# Patient Record
Sex: Female | Born: 1965 | Race: White | Hispanic: No | Marital: Single | Smoking: Former smoker
Health system: Southern US, Community
[De-identification: ages and names within clinical notes are randomized; demographics above are authoritative.]

## PROBLEM LIST (undated history)

## (undated) DIAGNOSIS — N870 Mild cervical dysplasia: Secondary | ICD-10-CM

## (undated) DIAGNOSIS — F419 Anxiety disorder, unspecified: Secondary | ICD-10-CM

## (undated) DIAGNOSIS — R011 Cardiac murmur, unspecified: Secondary | ICD-10-CM

## (undated) DIAGNOSIS — T7840XA Allergy, unspecified, initial encounter: Secondary | ICD-10-CM

## (undated) DIAGNOSIS — F32A Depression, unspecified: Secondary | ICD-10-CM

## (undated) DIAGNOSIS — F329 Major depressive disorder, single episode, unspecified: Secondary | ICD-10-CM

## (undated) DIAGNOSIS — D649 Anemia, unspecified: Secondary | ICD-10-CM

## (undated) HISTORY — PX: WISDOM TOOTH EXTRACTION: SHX21

## (undated) HISTORY — PX: OTHER SURGICAL HISTORY: SHX169

## (undated) HISTORY — DX: Depression, unspecified: F32.A

## (undated) HISTORY — DX: Anemia, unspecified: D64.9

## (undated) HISTORY — DX: Allergy, unspecified, initial encounter: T78.40XA

## (undated) HISTORY — DX: Anxiety disorder, unspecified: F41.9

## (undated) HISTORY — DX: Cardiac murmur, unspecified: R01.1

## (undated) HISTORY — PX: COLPOSCOPY: SHX161

## (undated) HISTORY — DX: Mild cervical dysplasia: N87.0

## (undated) HISTORY — DX: Major depressive disorder, single episode, unspecified: F32.9

---

## 1998-01-02 ENCOUNTER — Other Ambulatory Visit: Admission: RE | Admit: 1998-01-02 | Discharge: 1998-01-02 | Payer: Self-pay | Admitting: Obstetrics and Gynecology

## 2001-08-24 ENCOUNTER — Other Ambulatory Visit: Admission: RE | Admit: 2001-08-24 | Discharge: 2001-08-24 | Payer: Self-pay | Admitting: Gynecology

## 2002-01-24 HISTORY — PX: CERVICAL BIOPSY  W/ LOOP ELECTRODE EXCISION: SUR135

## 2002-08-04 ENCOUNTER — Other Ambulatory Visit: Admission: RE | Admit: 2002-08-04 | Discharge: 2002-08-04 | Payer: Self-pay | Admitting: Gynecology

## 2003-05-04 ENCOUNTER — Other Ambulatory Visit: Admission: RE | Admit: 2003-05-04 | Discharge: 2003-05-04 | Payer: Self-pay | Admitting: Gynecology

## 2003-07-30 ENCOUNTER — Emergency Department (HOSPITAL_COMMUNITY): Admission: EM | Admit: 2003-07-30 | Discharge: 2003-07-30 | Payer: Self-pay | Admitting: Emergency Medicine

## 2003-08-22 ENCOUNTER — Encounter: Admission: RE | Admit: 2003-08-22 | Discharge: 2003-08-22 | Payer: Self-pay | Admitting: Gynecology

## 2004-05-07 ENCOUNTER — Other Ambulatory Visit: Admission: RE | Admit: 2004-05-07 | Discharge: 2004-05-07 | Payer: Self-pay | Admitting: Gynecology

## 2005-03-19 ENCOUNTER — Encounter: Admission: RE | Admit: 2005-03-19 | Discharge: 2005-03-19 | Payer: Self-pay | Admitting: Gynecology

## 2005-05-27 ENCOUNTER — Other Ambulatory Visit: Admission: RE | Admit: 2005-05-27 | Discharge: 2005-05-27 | Payer: Self-pay | Admitting: Gynecology

## 2005-09-17 ENCOUNTER — Encounter: Admission: RE | Admit: 2005-09-17 | Discharge: 2005-09-17 | Payer: Self-pay | Admitting: Gynecology

## 2007-05-25 ENCOUNTER — Other Ambulatory Visit: Admission: RE | Admit: 2007-05-25 | Discharge: 2007-05-25 | Payer: Self-pay | Admitting: Gynecology

## 2007-09-14 ENCOUNTER — Encounter: Admission: RE | Admit: 2007-09-14 | Discharge: 2007-09-14 | Payer: Self-pay | Admitting: Gynecology

## 2008-09-26 ENCOUNTER — Encounter: Admission: RE | Admit: 2008-09-26 | Discharge: 2008-09-26 | Payer: Self-pay | Admitting: Gynecology

## 2009-09-27 ENCOUNTER — Encounter: Admission: RE | Admit: 2009-09-27 | Discharge: 2009-09-27 | Payer: Self-pay | Admitting: Gynecology

## 2010-07-03 ENCOUNTER — Other Ambulatory Visit (HOSPITAL_COMMUNITY)
Admission: RE | Admit: 2010-07-03 | Discharge: 2010-07-03 | Disposition: A | Discharge status: Home / Self Care | Payer: BC Managed Care – PPO | Source: Ambulatory Visit | Attending: Gynecology | Admitting: Gynecology

## 2010-07-03 ENCOUNTER — Other Ambulatory Visit: Payer: Self-pay | Admitting: Gynecology

## 2010-07-03 ENCOUNTER — Ambulatory Visit (INDEPENDENT_AMBULATORY_CARE_PROVIDER_SITE_OTHER): Payer: BC Managed Care – PPO | Admitting: Gynecology

## 2010-07-03 DIAGNOSIS — Z01419 Encounter for gynecological examination (general) (routine) without abnormal findings: Secondary | ICD-10-CM

## 2010-07-03 DIAGNOSIS — R635 Abnormal weight gain: Secondary | ICD-10-CM

## 2010-07-03 DIAGNOSIS — Z833 Family history of diabetes mellitus: Secondary | ICD-10-CM

## 2010-07-03 DIAGNOSIS — Z124 Encounter for screening for malignant neoplasm of cervix: Secondary | ICD-10-CM | POA: Insufficient documentation

## 2010-07-03 DIAGNOSIS — Z13220 Encounter for screening for lipoid disorders: Secondary | ICD-10-CM

## 2010-07-12 NOTE — Consult Note (Signed)
Ana Griffin NO.:  [id number]   MEDICAL RECORD NO.:  [id number]                   PATIENT TYPE:  EMS   LOCATION:  ED                                   FACILITY:  [facility]   PHYSICIAN:  Dionne Ano. Everlene Other, M.D.         DATE OF BIRTH:  1966/02/04   DATE OF CONSULTATION:  07/30/2003  DATE OF DISCHARGE:                                   CONSULTATION   I had the pleasure to see [name] today in the [facility] due to injury she sustained to her left hand.  The patient  was noted to have sustained a dog bite from her border collie who has his  shots.  Subsequent to this, the patient developed significant swelling and  ascending type lymphangitis and infection.  The bite in question is over the  PIP area.  She has had a CBC drawn at an urgent care which was minimally  high.  She has had no x-rays.  She complains of pain in the left hand in  general.  She denies fever, chills, nausea, vomiting.  The patient and I  have discussed this at length and her findings.  She denies history of prior  dog bites or injury to the hand.   The patient notes no other unusual exposures.   PAST MEDICAL HISTORY:  She has negative past medical history.   PAST SURGICAL HISTORY:  None.   MEDICATIONS:  Wellbutrin and Allegra.   ALLERGIES:  No known drug allergies.   SOCIAL HISTORY:  She quit cigarette smoking 46 days ago.  She works for  [employer].  She denies IVDA.   PHYSICAL EXAMINATION:  GENERAL APPEARANCE:  The patient is alert and  oriented, in no acute distress.  VITAL SIGNS:  Stable.  She is afebrile.  HEENT:  Within normal limits.  CHEST:  Clear.  CARDIOVASCULAR:  Regular rate.  ABDOMEN:  Nontender and nondistended.  EXTREMITIES:  She has no signs of swelling, edema or neurovascular  compromise in the lower extremities.  The left upper extremity has a  laceration of the PIP joint.  There is mild swelling here.   This is  transverse in nature.  The PIP joint is not particularly tender with passive  range of motion and there is no instability.  She does have redness, slight  warmth and ascending swelling in the hand over the MCP region, however,  there is no frank joint tenderness with MCP gentle range of motion  passively.  There are no signs of dystrophic reaction or vascular  compromise.   I have reviewed x-rays today.  Three views of the hand which showed no  fracture, dislocation, or space occupying lesion.  There is no foreign body  or evidence of dog tooth, etc.   IMPRESSION:  Dog bite, left hand about the PIP region of the index finger  with ascending lymphangitis (early tenosynovitis).  PLAN:  I have discussed with the patient our findings and discussed with her  risks and benefits of surgical intervention.  She was then taken to the  operating suite and underwent block anesthesia performed by myself with  lidocaine without epinephrine.  Following this, she was prepped and draped  in the usual sterile fashion with Betadine scrub and paint.  Once this was  done, I then performed an I&D of skin and subcutaneous tissue followed by  irrigation and I&D of the flexor tendon sheath apparatus.  She did not have  any frank disruption of the tendon at the central slip insertion of the PIP  joint nor was there deep encroachment into the PIP region.  I did identify  and irrigate copiously the extensor tendon sheath.  This did track  proximally.  Following this, I then wicked the wound.  She was then placed  on antibiotics in the form of Unasyn to be followed by Augmentin.  She was  placed on pain medicine in the form of Vicodin.  She will be monitored  extremely closely due to the ascending lymphangitis, etc.  I have discussed  with her do's and don'ts etc., and will plan to reassess her extremity  tomorrow.  She understands the do's and don'ts, risks and benefits of  treatment, etc., and all  questions have been encouraged and answered.                                               Dionne Ano. Everlene Other, M.D.    Nash Mantis  D:  07/30/2003  T:  07/31/2003  Job:  [id number]

## 2010-08-27 ENCOUNTER — Other Ambulatory Visit: Payer: Self-pay | Admitting: Gynecology

## 2010-08-27 DIAGNOSIS — Z1231 Encounter for screening mammogram for malignant neoplasm of breast: Secondary | ICD-10-CM

## 2010-10-08 ENCOUNTER — Ambulatory Visit
Admission: RE | Admit: 2010-10-08 | Discharge: 2010-10-08 | Disposition: A | Discharge status: Home / Self Care | Payer: BC Managed Care – PPO | Source: Ambulatory Visit | Attending: Gynecology | Admitting: Gynecology

## 2010-10-08 DIAGNOSIS — Z1231 Encounter for screening mammogram for malignant neoplasm of breast: Secondary | ICD-10-CM

## 2011-09-23 ENCOUNTER — Other Ambulatory Visit: Payer: Self-pay | Admitting: Gynecology

## 2011-09-23 DIAGNOSIS — Z1231 Encounter for screening mammogram for malignant neoplasm of breast: Secondary | ICD-10-CM

## 2011-10-01 ENCOUNTER — Encounter: Payer: BC Managed Care – PPO | Admitting: Gynecology

## 2011-10-16 ENCOUNTER — Ambulatory Visit
Admission: RE | Admit: 2011-10-16 | Discharge: 2011-10-16 | Disposition: A | Discharge status: Home / Self Care | Payer: BC Managed Care – PPO | Source: Ambulatory Visit | Attending: Gynecology | Admitting: Gynecology

## 2011-10-16 DIAGNOSIS — Z1231 Encounter for screening mammogram for malignant neoplasm of breast: Secondary | ICD-10-CM

## 2011-10-21 ENCOUNTER — Encounter: Payer: Self-pay | Admitting: Gynecology

## 2011-10-21 ENCOUNTER — Other Ambulatory Visit (HOSPITAL_COMMUNITY)
Admission: RE | Admit: 2011-10-21 | Discharge: 2011-10-21 | Disposition: A | Discharge status: Home / Self Care | Payer: BC Managed Care – PPO | Source: Ambulatory Visit | Attending: Gynecology | Admitting: Gynecology

## 2011-10-21 ENCOUNTER — Ambulatory Visit (INDEPENDENT_AMBULATORY_CARE_PROVIDER_SITE_OTHER): Payer: BC Managed Care – PPO | Admitting: Gynecology

## 2011-10-21 VITALS — BP 108/68 | Ht 63.0 in | Wt 152.0 lb

## 2011-10-21 DIAGNOSIS — F329 Major depressive disorder, single episode, unspecified: Secondary | ICD-10-CM

## 2011-10-21 DIAGNOSIS — N870 Mild cervical dysplasia: Secondary | ICD-10-CM | POA: Insufficient documentation

## 2011-10-21 DIAGNOSIS — R635 Abnormal weight gain: Secondary | ICD-10-CM | POA: Insufficient documentation

## 2011-10-21 DIAGNOSIS — Z01419 Encounter for gynecological examination (general) (routine) without abnormal findings: Secondary | ICD-10-CM

## 2011-10-21 DIAGNOSIS — Z23 Encounter for immunization: Secondary | ICD-10-CM

## 2011-10-21 DIAGNOSIS — N946 Dysmenorrhea, unspecified: Secondary | ICD-10-CM

## 2011-10-21 DIAGNOSIS — Z1151 Encounter for screening for human papillomavirus (HPV): Secondary | ICD-10-CM | POA: Insufficient documentation

## 2011-10-21 HISTORY — DX: Abnormal weight gain: R63.5

## 2011-10-21 MED ORDER — BUPROPION HCL ER (SR) 150 MG PO TB12: 150.0000 mg | ORAL_TABLET | Freq: Two times a day (BID) | ORAL | Status: DC

## 2011-10-21 MED ORDER — IBUPROFEN 800 MG PO TABS: 800.0000 mg | ORAL_TABLET | Freq: Three times a day (TID) | ORAL | Status: AC | PRN

## 2011-10-21 NOTE — Patient Instructions (Addendum)
Diphtheria, Tetanus, and Pertussis (DTaP) Vaccine What You Need to Know WHY GET VACCINATED? Diphtheria, tetanus, and pertussis are serious diseases caused by bacteria. Diphtheria and pertussis are spread from person to person. Tetanus enters the body through cuts or wounds. Diphtheria causes a thick covering in the back of the throat.  It can lead to breathing problems, paralysis, heart failure, and even death.  Tetanus (Lockjaw) causes painful tightening of the muscles, usually all over the body.  It can lead to "locking" of the jaw so the victim cannot open his or her mouth or swallow. Tetanus leads to death in about 2 out of 10 cases.  Pertussis (Whooping Cough) causes coughing spells so bad that it is hard for infants to eat, drink, or breathe. These spells can last for weeks.  It can lead to pneumonia, seizures (jerking and staring spells), brain damage, and death.  Diphtheria, tetanus, and pertussis vaccine (DTaP) can help prevent these diseases. Most children who are vaccinated with DTaP will be protected throughout childhood. Many more children would get these diseases if we stopped vaccinating. DTaP is a safer version of an older vaccine called DTP. DTP is no longer used in the [place]. WHO SHOULD GET DTAP VACCINE AND WHEN? Children should get 5 doses of DTaP vaccine, 1 dose at each of the following ages:  2 months.   4 months.   6 months.   15 to 18 months.   4 to 6 years.  DTaP may be given at the same time as other vaccines. SOME CHILDREN SHOULD NOT GET DTAP VACCINE OR SHOULD WAIT  Children with minor illnesses, such as a cold, may be vaccinated. But children who are moderately or severely ill should usually wait until they recover before getting DTaP vaccine.   Any child who had a life-threatening allergic reaction after a dose of DTaP should not get another dose.   Any child who suffered a brain or nervous system disease within 7 days after a dose of DTaP should  not get another dose.   Talk with your caregiver if your child:   Had a seizure or collapsed after a dose of DTaP.   Cried non-stop for 3 hours or more after a dose of DTaP.   Had a fever over 105 F (40.6 C) after a dose of DTaP.   Ask your caregiver for more information. Some of these children should not get another dose of pertussis vaccine, but may get a vaccine without pertussis, called DT.  OLDER CHILDREN AND ADULTS  DTaP is not licensed for adolescents, adults, or children 1 years of age and older.   But older people still need protection. A vaccine called Tdap is similar to DTaP. A single dose of Tdap is recommended for people 11 through 44 years of age. Another vaccine, called Td, protects against tetanus and diphtheria, but not pertussis. It is recommended every 10 years.  WHAT ARE THE RISKS FROM DTAP VACCINE?  Getting diphtheria, tetanus, or pertussis disease is much riskier than getting DTaP vaccine.   However, a vaccine, like any medicine, is capable of causing serious problems, such as severe allergic reactions. The risk of DTaP vaccine causing serious harm, or death, is extremely small.  Mild Problems (Common)  Fever (up to about 1 child in 4).   Redness or swelling where the shot was given (up to about 1 child in 4).   Soreness or tenderness where the shot was given (up to about 1 child in 4).  These problems occur more often after the 4th and 5th doses of the DTaP series than after earlier doses. Sometimes the 4th or 5th dose of DTaP vaccine is followed by swelling of the entire arm or leg in which the shot was given, lasting 1 to 7 days (up to about 1 child in 30). Other mild problems include:  Fussiness (up to about 1 child in 3).   Tiredness or poor appetite (up to about 1 child in 10).   Vomiting (up to about 1 child in 50).  These problems generally occur 1 to 3 days after the shot. Moderate Problems (Uncommon)  Seizure (jerking or staring) (about 1 child  out of 14,000).   Non-stop crying, for 3 hours or more (up to about 1 child out of 1,000).   High fever, over 105 F (40.6 C) (about 1 child out of 16,000).  Severe Problems (Very Rare)  Serious allergic reaction (less than 1 out of a million doses).   Several other severe problems have been reported after DTaP vaccine. These include:   Long-term seizures, coma, or lowered consciousness.   Permanent brain damage.  These are so rare it is hard to tell if they are caused by the vaccine. Controlling fever is especially important for children who have had seizures, for any reason. It is also important if another family member has had seizures. You can reduce fever and pain by giving your child an aspirin-free pain reliever when the shot is given, and for the next 24 hours, following the package instructions. WHAT IF THERE IS A MODERATE OR SEVERE REACTION? What should I look for? Any unusual conditions, such as a serious allergic reaction, high fever, or unusual behavior. Serious allergic reactions are extremely rare with any vaccine. If one were to occur, it would most likely be within a few minutes to a few hours after the shot. Signs can include difficulty breathing, hoarseness or wheezing, hives, paleness, weakness, a fast heartbeat, or dizziness. If a high fever or seizure were to occur, it would usually be within a week after the shot. What should I do?  Call your caregiver or get the person to a caregiver right away.   Tell the caregiver what happened, the date and time it happened, and when the vaccination was given.   Ask the caregiver, nurse, or health department to file a Vaccine Adverse Event Reporting System (VAERS) form. Or, you can file this report through the VAERS website at www.vaers.LAgents.no or by calling 1-3300768461.  VAERS does not provide medical advice. THE NATIONAL VACCINE INJURY COMPENSATION PROGRAM  In the rare event that you or your child has a serious reaction  to a vaccine, a federal program has been created to help you pay for the care of those who have been harmed.   For details about the National Vaccine Injury Compensation Program, call 705-646-9892 or visit the program's website at SpiritualWord.at  HOW CAN I LEARN MORE?  Ask your caregiver. They can give you the vaccine package insert or suggest other sources of information.   Call your local or state health department's immunization program.   Contact the Centers for Disease Control and Prevention (CDC):   Call 6286038875 (1-800-CDC-INFO).   Visit the [employer] at PicCapture.uy  CDC Diphtheria, Tetanus, and Pertussis (DTaP) Vaccine VIS (07/10/05) Document Released: 12/08/2005 Document Revised: 01/30/2011 Document Reviewed: 12/08/2005 [facility] Patient Information 2012 [place], [place].  Health Maintenance, Females A healthy lifestyle and preventative care can promote health and  wellness.  Maintain regular health, dental, and eye exams.   Eat a healthy diet. Foods like vegetables, fruits, whole grains, low-fat dairy products, and lean protein foods contain the nutrients you need without too many calories. Decrease your intake of foods high in solid fats, added sugars, and salt. Get information about a proper diet from your caregiver, if necessary.   Regular physical exercise is one of the most important things you can do for your health. Most adults should get at least 150 minutes of moderate-intensity exercise (any activity that increases your heart rate and causes you to sweat) each week. In addition, most adults need muscle-strengthening exercises on 2 or more days a week.    Maintain a healthy weight. The body mass index (BMI) is a screening tool to identify possible weight problems. It provides an estimate of body fat based on height and weight. Your caregiver can help determine your BMI, and can help you achieve or maintain a  healthy weight. For adults 20 years and older:   A BMI below 18.5 is considered underweight.   A BMI of 18.5 to 24.9 is normal.   A BMI of 25 to 29.9 is considered overweight.   A BMI of 30 and above is considered obese.   Maintain normal blood lipids and cholesterol by exercising and minimizing your intake of saturated fat. Eat a balanced diet with plenty of fruits and vegetables. Blood tests for lipids and cholesterol should begin at age 69 and be repeated every 5 years. If your lipid or cholesterol levels are high, you are over 50, or you are a high risk for heart disease, you may need your cholesterol levels checked more frequently.Ongoing high lipid and cholesterol levels should be treated with medicines if diet and exercise are not effective.   If you smoke, find out from your caregiver how to quit. If you do not use tobacco, do not start.   If you are pregnant, do not drink alcohol. If you are breastfeeding, be very cautious about drinking alcohol. If you are not pregnant and choose to drink alcohol, do not exceed 1 drink per day. One drink is considered to be 12 ounces (355 mL) of beer, 5 ounces (148 mL) of wine, or 1.5 ounces (44 mL) of liquor.   Avoid use of street drugs. Do not share needles with anyone. Ask for help if you need support or instructions about stopping the use of drugs.   High blood pressure causes heart disease and increases the risk of stroke. Blood pressure should be checked at least every 1 to 2 years. Ongoing high blood pressure should be treated with medicines, if weight loss and exercise are not effective.   If you are 57 to 44 years old, ask your caregiver if you should take aspirin to prevent strokes.   Diabetes screening involves taking a blood sample to check your fasting blood sugar level. This should be done once every 3 years, after age 11, if you are within normal weight and without risk factors for diabetes. Testing should be considered at a younger  age or be carried out more frequently if you are overweight and have at least 1 risk factor for diabetes.   Breast cancer screening is essential preventative care for women. You should practice "breast self-awareness." This means understanding the normal appearance and feel of your breasts and may include breast self-examination. Any changes detected, no matter how small, should be reported to a caregiver. Women in their 43s and  30s should have a clinical breast exam (CBE) by a caregiver as part of a regular health exam every 1 to 3 years. After age 61, women should have a CBE every year. Starting at age 60, women should consider having a mammogram (breast X-ray) every year. Women who have a family history of breast cancer should talk to their caregiver about genetic screening. Women at a high risk of breast cancer should talk to their caregiver about having an MRI and a mammogram every year.   The Pap test is a screening test for cervical cancer. Women should have a Pap test starting at age 32. Between ages 45 and 40, Pap tests should be repeated every 2 years. Beginning at age 46, you should have a Pap test every 3 years as long as the past 3 Pap tests have been normal. If you had a hysterectomy for a problem that was not cancer or a condition that could lead to cancer, then you no longer need Pap tests. If you are between ages 60 and 63, and you have had normal Pap tests going back 10 years, you no longer need Pap tests. If you have had past treatment for cervical cancer or a condition that could lead to cancer, you need Pap tests and screening for cancer for at least 20 years after your treatment. If Pap tests have been discontinued, risk factors (such as a new sexual partner) need to be reassessed to determine if screening should be resumed. Some women have medical problems that increase the chance of getting cervical cancer. In these cases, your caregiver may recommend more frequent screening and Pap  tests.   The human papillomavirus (HPV) test is an additional test that may be used for cervical cancer screening. The HPV test looks for the virus that can cause the cell changes on the cervix. The cells collected during the Pap test can be tested for HPV. The HPV test could be used to screen women aged 1 years and older, and should be used in women of any age who have unclear Pap test results. After the age of 51, women should have HPV testing at the same frequency as a Pap test.   Colorectal cancer can be detected and often prevented. Most routine colorectal cancer screening begins at the age of 37 and continues through age 34. However, your caregiver may recommend screening at an earlier age if you have risk factors for colon cancer. On a yearly basis, your caregiver may provide home test kits to check for hidden blood in the stool. Use of a small camera at the end of a tube, to directly examine the colon (sigmoidoscopy or colonoscopy), can detect the earliest forms of colorectal cancer. Talk to your caregiver about this at age 36, when routine screening begins. Direct examination of the colon should be repeated every 5 to 10 years through age 36, unless early forms of pre-cancerous polyps or small growths are found.   Hepatitis C blood testing is recommended for all people born from [birth year] through 1963 and any individual with known risks for hepatitis C.   Practice safe sex. Use condoms and avoid high-risk sexual practices to reduce the spread of sexually transmitted infections (STIs). Sexually active women aged 42 and younger should be checked for Chlamydia, which is a common sexually transmitted infection. Older women with new or multiple partners should also be tested for Chlamydia. Testing for other STIs is recommended if you are sexually active and at increased risk.  Osteoporosis is a disease in which the bones lose minerals and strength with aging. This can result in serious bone fractures. The  risk of osteoporosis can be identified using a bone density scan. Women ages 51 and over and women at risk for fractures or osteoporosis should discuss screening with their caregivers. Ask your caregiver whether you should be taking a calcium supplement or vitamin D to reduce the rate of osteoporosis.   Menopause can be associated with physical symptoms and risks. Hormone replacement therapy is available to decrease symptoms and risks. You should talk to your caregiver about whether hormone replacement therapy is right for you.   Use sunscreen with a sun protection factor (SPF) of 30 or greater. Apply sunscreen liberally and repeatedly throughout the day. You should seek shade when your shadow is shorter than you. Protect yourself by wearing long sleeves, pants, a wide-brimmed hat, and sunglasses year round, whenever you are outdoors.   Notify your caregiver of new moles or changes in moles, especially if there is a change in shape or color. Also notify your caregiver if a mole is larger than the size of a pencil eraser.   Stay current with your immunizations.  Document Released: 08/26/2010 Document Revised: 01/30/2011 Document Reviewed: 08/26/2010 [facility] Patient Information 2012 [place], [place].  Exercise to Lose Weight Exercise and a healthy diet may help you lose weight. Your doctor may suggest specific exercises. EXERCISE IDEAS AND TIPS  Choose low-cost things you enjoy doing, such as walking, bicycling, or exercising to workout videos.   Take stairs instead of the elevator.   Walk during your lunch break.   Park your car further away from work or school.   Go to a gym or an exercise class.   Start with 5 to 10 minutes of exercise each day. Build up to 30 minutes of exercise 4 to 6 days a week.   Wear shoes with good support and comfortable clothes.   Stretch before and after working out.   Work out until you breathe harder and your heart beats faster.   Drink extra water when  you exercise.   Do not do so much that you hurt yourself, feel dizzy, or get very short of breath.  Exercises that burn about 150 calories:  Running 1  miles in 15 minutes.   Playing volleyball for 45 to 60 minutes.   Washing and waxing a car for 45 to 60 minutes.   Playing touch football for 45 minutes.   Walking 1  miles in 35 minutes.   Pushing a stroller 1  miles in 30 minutes.   Playing basketball for 30 minutes.   Raking leaves for 30 minutes.   Bicycling 5 miles in 30 minutes.   Walking 2 miles in 30 minutes.   Dancing for 30 minutes.   Shoveling snow for 15 minutes.   Swimming laps for 20 minutes.   Walking up stairs for 15 minutes.   Bicycling 4 miles in 15 minutes.   Gardening for 30 to 45 minutes.   Jumping rope for 15 minutes.   Washing windows or floors for 45 to 60 minutes.  Document Released: 03/15/2010 Document Revised: 10/23/2010 Document Reviewed: 03/15/2010 [facility] Patient Information 2012 [place], [place].

## 2011-10-21 NOTE — Progress Notes (Signed)
Ana Griffin Mar 01, 1965 [id number]   History:    46 y.o.  for annual gyn exam with the only complaint being of dysmenorrhea. 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 She's had normal Pap smear since then. She suffers from depression and takes Wellbutrin SR 150 mg twice a day. Patient is still overweight although she did loose 6 pound from last year. Patient does her monthly self breast examination and her last mammogram was in August of this year which was normal.  Past medical history,surgical history, family history and social history were all reviewed and documented in the EPIC chart.  Gynecologic History Patient's last menstrual period was 10/13/2011. Contraception: none Last Pap: 2012. Results were: normal Last mammogram: 2013. Results were: normal  Obstetric History OB History    Grav Para Term Preterm Abortions TAB SAB Ect Mult Living   1 0   1     0     # Outc Date GA Lbr Len/2nd Wgt Sex Del Anes PTL Lv   1 ABT                ROS: A ROS was performed and pertinent positives and negatives are included in the history.  GENERAL: No fevers or chills. HEENT: No change in vision, no earache, sore throat or sinus congestion. NECK: No pain or stiffness. CARDIOVASCULAR: No chest pain or pressure. No palpitations. PULMONARY: No shortness of breath, cough or wheeze. GASTROINTESTINAL: No abdominal pain, nausea, vomiting or diarrhea, melena or bright red blood per rectum. GENITOURINARY: No urinary frequency, urgency, hesitancy or dysuria. MUSCULOSKELETAL: No joint or muscle pain, no back pain, no recent trauma. DERMATOLOGIC: No rash, no itching, no lesions. ENDOCRINE: No polyuria, polydipsia, no heat or cold intolerance. No recent change in weight. HEMATOLOGICAL: No anemia or easy bruising or bleeding. NEUROLOGIC: No headache, seizures, numbness, tingling or  weakness. PSYCHIATRIC: No depression, no loss of interest in normal activity or change in sleep pattern.     Exam: chaperone present  BP 108/68  Ht 5\' 3"  (1.6 m)  Wt 152 lb (68.947 kg)  BMI 26.93 kg/m2  LMP 10/13/2011  Body mass index is 26.93 kg/(m^2).  General appearance : Well developed well nourished female. No acute distress HEENT: Neck supple, trachea midline, no carotid bruits, no thyroidmegaly Lungs: Clear to auscultation, no rhonchi or wheezes, or rib retractions  Heart: Regular rate and rhythm, no murmurs or gallops Breast:Examined in sitting and supine position were symmetrical in appearance, no palpable masses or tenderness,  no skin retraction, no nipple inversion, no nipple discharge, no skin discoloration, no axillary or supraclavicular lymphadenopathy Abdomen: no palpable masses or tenderness, no rebound or guarding Extremities: no edema or skin discoloration or tenderness  Pelvic:  Bartholin, Urethra, Skene Glands: Within normal limits             Vagina: No gross lesions or discharge  Cervix: No gross lesions or discharge  Uterus  anteverted, normal size, shape and consistency, non-tender and mobile  Adnexa  Without masses or tenderness  Anus and perineum  normal   Rectovaginal  normal sphincter tone without palpated masses or tenderness             Hemoccult not done   Patient does not recall ever having received a Tdap vaccine she was counseled and will receive a dose today  Assessment/Plan:  46  y.o. female for annual exam who will receive a Tdap vaccine today. Prescription refill for Wellbutrin SR 150 mg one by mouth twice a day for her depression was provided. Her dysmenorrhea she will take Motrin 800 mg 3 times a day when necessary. The following labs will be drawn today: Screening cholesterol, hemoglobin A1c, CBC, TSH, urinalysis and Pap smear. We did discuss the new Pap smear screening guidelines. We discussed importance of calcium and vitamin D for  osteoporosis prevention as well as regular exercise. Literature information on exercise and cholesterol lowering diet was provided.    Ok Edwards MD, 5:10 PM 10/21/2011

## 2011-10-22 LAB — URINALYSIS W MICROSCOPIC + REFLEX CULTURE
Bilirubin Urine: NEGATIVE
Casts: NONE SEEN
Crystals: NONE SEEN
Glucose, UA: NEGATIVE mg/dL
Hgb urine dipstick: NEGATIVE
Ketones, ur: NEGATIVE mg/dL
Leukocytes, UA: NEGATIVE
Nitrite: NEGATIVE
Protein, ur: NEGATIVE mg/dL
Specific Gravity, Urine: 1.012 (ref 1.005–1.030)
Squamous Epithelial / HPF: NONE SEEN
Urobilinogen, UA: 0.2 mg/dL (ref 0.0–1.0)
pH: 7.5 (ref 5.0–8.0)

## 2011-10-22 LAB — CBC WITH DIFFERENTIAL/PLATELET
Lymphocytes Relative: 25 % (ref 12–46)
MCV: 90.1 fL (ref 78.0–100.0)
Monocytes Absolute: 0.5 10*3/uL (ref 0.1–1.0)
Neutro Abs: 4.9 10*3/uL (ref 1.7–7.7)
Neutrophils Relative %: 67 % (ref 43–77)

## 2011-10-22 LAB — CHOLESTEROL, TOTAL: Cholesterol: 181 mg/dL (ref 0–200)

## 2011-10-22 LAB — HEMOGLOBIN A1C
Hgb A1c MFr Bld: 5.4 % (ref <5.7)
Mean Plasma Glucose: 108 mg/dL (ref <117)

## 2011-10-22 LAB — TSH: TSH: 1.061 u[IU]/mL (ref 0.350–4.500)

## 2011-10-24 ENCOUNTER — Other Ambulatory Visit: Payer: Self-pay | Admitting: Gynecology

## 2011-10-24 MED ORDER — NITROFURANTOIN MONOHYD MACRO 100 MG PO CAPS: 100.0000 mg | ORAL_CAPSULE | Freq: Two times a day (BID) | ORAL | Status: AC

## 2011-10-25 LAB — URINE CULTURE: Colony Count: >=100000

## 2011-10-28 ENCOUNTER — Telehealth: Payer: Self-pay | Admitting: *Deleted

## 2011-10-28 ENCOUNTER — Encounter: Payer: Self-pay | Admitting: Gynecology

## 2011-10-28 ENCOUNTER — Ambulatory Visit (INDEPENDENT_AMBULATORY_CARE_PROVIDER_SITE_OTHER): Payer: BC Managed Care – PPO | Admitting: Gynecology

## 2011-10-28 VITALS — BP 110/70

## 2011-10-28 DIAGNOSIS — R102 Pelvic and perineal pain: Secondary | ICD-10-CM

## 2011-10-28 DIAGNOSIS — N949 Unspecified condition associated with female genital organs and menstrual cycle: Secondary | ICD-10-CM

## 2011-10-28 DIAGNOSIS — R51 Headache: Secondary | ICD-10-CM

## 2011-10-28 DIAGNOSIS — N938 Other specified abnormal uterine and vaginal bleeding: Secondary | ICD-10-CM

## 2011-10-28 MED ORDER — MEGESTROL ACETATE 40 MG PO TABS: 40.0000 mg | ORAL_TABLET | Freq: Two times a day (BID) | ORAL | Status: AC

## 2011-10-28 NOTE — Telephone Encounter (Signed)
Pt informed with the below, transferred to appointment desk.

## 2011-10-28 NOTE — Progress Notes (Signed)
Patient presented to the office today stating that her last menstrual period was 2 weeks ago and yesterday when she wiped she noted some redness when she wiped and then started having some cramping. She is currently under treatment for urinary tract infection for which she is on her fourth day of Macrobid. This bleeding today was like a menstrual period although 2 weeks early. She is not sexually active. She was seen in the office on August 27 for her annual exam and the following labs were drawn which were normal: Blood sugar, total cholesterol, CBC and TSH. Her Pap smear was also normal. The patient has complained of headaches on and off for the past several weeks. Exam: Abdomen: Soft nontender no rebound or guarding Pelvic: Bartholin urethra Skene was within normal limits Vagina: Dark brown blood was noted in the vaginal vault Cervix: No active bleeding Uterus: Anteverted normal size shape and consistency Adnexa: No palpable masses or tenderness Rectal exam: Not done  Assessment/plan: Patient with dysfunctional uterine bleeding was counseled for an endometrial biopsy and her cervix was cleansed with Betadine solution. Upon insertion of the Pipelle patient could not tolerate the discomfort so the procedure was aborted. She will be asked to return back to the office at the end of the week for sonohysterogram for better assessment of her intrauterine cavity as well as her adnexa. She was asked to take Motrin 800 mg an hour before the procedure. We'll possibly have to do a paracervical block to minimize her discomfort and during the sonohysterogram and possible attempt endometrial biopsy. The following labs will be drawn today to complete evaluation: Prolactin and FSH. She'll be given a prescription of Megace 40 mg to take 1 by mouth twice a day for the next 5 days to stop her bleeding. She'll also complete her Macrobid for previously diagnosed urinary tract infection.

## 2011-10-28 NOTE — Telephone Encounter (Signed)
Pt called was given rx for Macrobid x 7 days on 10/24/11. Pt still taking rx and today noticed thick dark vaginal blood, not heavy, nor it is spotting. Pt said this has never happened before, her LMP;10/13/11. She has some stomach discomfort as well with medication, with nausea last night as well. Pt asked if this could be done to Macrobid? Please advise

## 2011-10-28 NOTE — Telephone Encounter (Signed)
That this is not typical from taking the Macrobid. She may need to come in to office this afternoon to be examined.

## 2011-10-28 NOTE — Patient Instructions (Addendum)
Take a 800mg  motrin 1 hour before office visit Friday

## 2011-10-29 LAB — PROLACTIN: Prolactin: 19.9 ng/mL

## 2011-10-31 ENCOUNTER — Other Ambulatory Visit: Payer: Self-pay | Admitting: Gynecology

## 2011-10-31 ENCOUNTER — Encounter: Payer: Self-pay | Admitting: Gynecology

## 2011-10-31 ENCOUNTER — Ambulatory Visit (INDEPENDENT_AMBULATORY_CARE_PROVIDER_SITE_OTHER): Payer: BC Managed Care – PPO | Admitting: Gynecology

## 2011-10-31 ENCOUNTER — Ambulatory Visit: Payer: BC Managed Care – PPO | Admitting: Gynecology

## 2011-10-31 ENCOUNTER — Ambulatory Visit (INDEPENDENT_AMBULATORY_CARE_PROVIDER_SITE_OTHER): Payer: BC Managed Care – PPO

## 2011-10-31 DIAGNOSIS — N938 Other specified abnormal uterine and vaginal bleeding: Secondary | ICD-10-CM

## 2011-10-31 DIAGNOSIS — N949 Unspecified condition associated with female genital organs and menstrual cycle: Secondary | ICD-10-CM

## 2011-10-31 DIAGNOSIS — R51 Headache: Secondary | ICD-10-CM

## 2011-10-31 MED ORDER — LIDOCAINE HCL 1 % IJ SOLN
10.0000 mL | Freq: Once | INTRAMUSCULAR | Status: AC
  Administered 2011-10-31: 10 mL

## 2011-10-31 NOTE — Progress Notes (Signed)
Patient presented to the office today for sonohysterogram along with an endometrial biopsy as a result of patient's recent dysfunctional uterine bleeding. Please see previous note dated 10/28/2011. Patient's recent lab work consisting of the following: Blood sugar, total cholesterol, CBC, TSH and FSH were all normal. Her Pap smear was normal also.  Patient was counseled for endometrial biopsy. Patient had taken Motrin 800 mg before coming to the office. The cervix was cleansed with Betadine solution and 1% lidocaine was infiltrated into the cervical stroma at the 2, 4 and 8 and 10:00 position for a total of 10 cc. The cervix required some dilatation with the rubber dilator.  A sterile sonohysterogram catheter was introduced into the intrauterine cavity and normal saline was instilled no intracavitary defect was noted. Following this a Pipelle was introduced into the intrauterine cavity moderate amount of tissue was obtained and was submitted for histological evaluation. Ultrasound was demonstrated normal uterus with endometrial stripe 3.3 mm and normal ovaries.  Assessment/plan: Etiology for isolated event the dysfunction bleeding? All blood tests were normal. Patient will maintain a menstrual calendar for the next 6 months and will continue to monitor. She was reassured. We will call her with the results of the biopsy once it becomes available.

## 2011-11-10 ENCOUNTER — Other Ambulatory Visit: Payer: Self-pay | Admitting: Family Medicine

## 2011-11-10 NOTE — Telephone Encounter (Signed)
Patient's chart is at the nurses station in the pa pool pile.  [facility] [id number]

## 2012-03-03 DIAGNOSIS — Z0279 Encounter for issue of other medical certificate: Secondary | ICD-10-CM

## 2012-04-16 ENCOUNTER — Other Ambulatory Visit: Payer: Self-pay

## 2012-10-15 ENCOUNTER — Encounter: Payer: Self-pay | Admitting: Internal Medicine

## 2012-10-15 ENCOUNTER — Ambulatory Visit (INDEPENDENT_AMBULATORY_CARE_PROVIDER_SITE_OTHER): Payer: BC Managed Care – PPO | Admitting: Internal Medicine

## 2012-10-15 VITALS — BP 118/68 | HR 78 | Temp 98.2°F | Resp 18 | Ht 64.0 in | Wt 157.6 lb

## 2012-10-15 DIAGNOSIS — R42 Dizziness and giddiness: Secondary | ICD-10-CM

## 2012-10-15 DIAGNOSIS — Z0000 Encounter for general adult medical examination without abnormal findings: Secondary | ICD-10-CM

## 2012-10-15 DIAGNOSIS — G4700 Insomnia, unspecified: Secondary | ICD-10-CM

## 2012-10-15 LAB — COMPREHENSIVE METABOLIC PANEL
Albumin: 4.7 g/dL (ref 3.5–5.2)
Alkaline Phosphatase: 57 U/L (ref 39–117)
BUN: 10 mg/dL (ref 6–23)
Calcium: 9.3 mg/dL (ref 8.4–10.5)
Chloride: 99 mEq/L (ref 96–112)
Creat: 0.98 mg/dL (ref 0.50–1.10)
Glucose, Bld: 87 mg/dL (ref 70–99)
Potassium: 3.7 mEq/L (ref 3.5–5.3)

## 2012-10-15 LAB — POCT CBC
Granulocyte percent: 70.1 %G (ref 37–80)
HCT, POC: 42.3 % (ref 37.7–47.9)
Lymph, poc: 1.6 (ref 0.6–3.4)
MCH, POC: 30.9 pg (ref 27–31.2)
MCV: 94.9 fL (ref 80–97)
MID (cbc): 0.3 (ref 0–0.9)
POC LYMPH PERCENT: 24.9 %L (ref 10–50)
RDW, POC: 12.8 %
WBC: 6.5 10*3/uL (ref 4.6–10.2)

## 2012-10-15 LAB — LIPID PANEL
Cholesterol: 229 mg/dL — ABNORMAL HIGH (ref 0–200)
HDL: 52 mg/dL (ref >39)
Triglycerides: 161 mg/dL — ABNORMAL HIGH (ref <150)

## 2012-10-15 MED ORDER — CLONAZEPAM 0.5 MG PO TABS: 0.5000 mg | ORAL_TABLET | Freq: Every evening | ORAL | Status: DC | PRN

## 2012-10-15 MED ORDER — BUPROPION HCL ER (SR) 150 MG PO TB12: ORAL_TABLET | ORAL | Status: DC

## 2012-10-15 NOTE — Progress Notes (Signed)
Subjective:    Patient ID: Ana Griffin, female    DOB: [birth year], 47 y.o.   MRN: [id number]  HPICPE Bad year /3 deaths to deal with:incl both parents but no longer having to be caretaker of Mom Kids in late 83s adopted-one w/MS-new grandbaby on way "Very stressful at work Lots of reactive depression and anxiety with  trouble sleeping//off meds that were helpful in past Overweight Not much exercise/not emphasizing time for her self  Family History  Problem Relation Age of Onset  . Hypertension Mother   . Diabetes Mother   . Stroke Mother   . Breast cancer Maternal Aunt   . Cancer Maternal Aunt     BRAIN   Patient Active Problem List   Diagnosis Date Noted  . DUB (dysfunctional uterine bleeding) 10/28/2011  . 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 Depression 10/21/2011  . Weight gain 10/21/2011  . Dysmenorrhea 10/21/2011    immun utd   Review of Systems  Constitutional: Positive for activity change and fatigue. Negative for fever, appetite change and unexpected weight change.  HENT: Negative for hearing loss, trouble swallowing and neck pain.   Eyes: Negative for photophobia and visual disturbance.  Respiratory: Negative for apnea, cough, chest tightness and wheezing.   Cardiovascular: Negative for chest pain, palpitations and leg swelling.  Gastrointestinal: Negative for nausea, abdominal pain, diarrhea, constipation and blood in stool.  Endocrine: Negative for cold intolerance, polydipsia, polyphagia and polyuria.  Genitourinary: Negative for frequency, difficulty urinating and menstrual problem.  Musculoskeletal: Negative for myalgias, back pain, joint swelling, arthralgias and gait problem.  Skin: Negative for rash.  Neurological: Positive for light-headedness. Negative for tremors, syncope and headaches.       Has had some lightheadedness on arising for the past 3 or 4 days. Not preventing activity or driving Not associated with vision changes or nausea   Hematological: Negative for adenopathy. Does not bruise/bleed easily.  Psychiatric/Behavioral: Positive for sleep disturbance and dysphoric mood. Negative for suicidal ideas, hallucinations, self-injury and agitation. The patient is not hyperactive.        Objective:   Physical Exam  Constitutional: She is oriented to person, place, and time. She appears well-developed and well-nourished.  HENT:  Head: Normocephalic.  Right Ear: External ear normal.  Left Ear: External ear normal.  Nose: Nose normal.  Mouth/Throat: Oropharynx is clear and moist.  Eyes: Conjunctivae and EOM are normal. Pupils are equal, round, and reactive to light.  Neck: Normal range of motion. Neck supple. No thyromegaly present.  Cardiovascular: Normal rate, regular rhythm, normal heart sounds and intact distal pulses.   No murmur heard. Pulmonary/Chest: Effort normal and breath sounds normal. She has no wheezes. She exhibits no tenderness.  Abdominal: Soft. Bowel sounds are normal. She exhibits no distension and no mass. There is no tenderness. There is no rebound and no guarding.  Musculoskeletal: Normal range of motion. She exhibits no edema and no tenderness.  Lymphadenopathy:    She has no cervical adenopathy.  Neurological: She is alert and oriented to person, place, and time. She has normal reflexes. No cranial nerve deficit.  Skin: No rash noted.  Psychiatric: Judgment and thought content normal.  Tearful at times  BP 118/68  Pulse 78  Temp(Src) 98.2 F (36.8 C) (Oral)  Resp 18  Ht 5\' 4"  (1.626 m)  Wt 157 lb 9.6 oz (71.487 kg)  BMI 27.04 kg/m2  SpO2 98%  LMP 10/01/2012   Results for orders placed in visit on 10/15/12  COMPREHENSIVE METABOLIC  PANEL      Result Value Range   Sodium 136  135 - 145 mEq/L   Potassium 3.7  3.5 - 5.3 mEq/L   Chloride 99  96 - 112 mEq/L   CO2 29  19 - 32 mEq/L   Glucose, Bld 87  70 - 99 mg/dL   BUN 10  6 - 23 mg/dL   Creat 1.61  0.96 - 0.45 mg/dL   Total  Bilirubin 0.5  0.3 - 1.2 mg/dL   Alkaline Phosphatase 57  39 - 117 U/L   AST 16  0 - 37 U/L   ALT 11  0 - 35 U/L   Total Protein 7.4  6.0 - 8.3 g/dL   Albumin 4.7  3.5 - 5.2 g/dL   Calcium 9.3  8.4 - 40.9 mg/dL  LIPID PANEL      Result Value Range   Cholesterol 229 (*) 0 - 200 mg/dL   Triglycerides 811 (*) <150 mg/dL   HDL 52  >91 mg/dL   Total CHOL/HDL Ratio 4.4     VLDL 32  0 - 40 mg/dL   LDL Cholesterol 478 (*) 0 - 99 mg/dL  TSH      Result Value Range   TSH 1.277  0.350 - 4.500 uIU/mL  POCT CBC      Result Value Range   WBC 6.5  4.6 - 10.2 K/uL   Lymph, poc 1.6  0.6 - 3.4   POC LYMPH PERCENT 24.9  10 - 50 %L   MID (cbc) 0.3  0 - 0.9   POC MID % 5.0  0 - 12 %M   POC Granulocyte 4.6  2 - 6.9   Granulocyte percent 70.1  37 - 80 %G   RBC 4.46  4.04 - 5.48 M/uL   Hemoglobin 13.8  12.2 - 16.2 g/dL   HCT, POC 29.5  62.1 - 47.9 %   MCV 94.9  80 - 97 fL   MCH, POC 30.9  27 - 31.2 pg   MCHC 32.6  31.8 - 35.4 g/dL   RDW, POC 30.8     Platelet Count, POC 250  142 - 424 K/uL   MPV 9.5  0 - 99.8 fL  POCT GLYCOSYLATED HEMOGLOBIN (HGB A1C)      Result Value Range   Hemoglobin A1C 4.9           Assessment & Plan:  Insomnia with reactive depression and anxiety- Plan: start buPROPion (WELLBUTRIN SR) 150 MG 12 hr tablet, use clonazePAM (KLONOPIN) 0.5 MG tablet hs  Consider counseling/reorganize her life Annual physical exam--gyn next week  Dizziness----follow-seems like BPV  BMI 27 w/ LDL 145---exercise!!!  Meds ordered this encounter  Medications  . Ascorbic Acid (VITAMIN C DROPS MT)    Sig: Use as directed in the mouth or throat.  Marland Kitchen buPROPion (WELLBUTRIN SR) 150 MG 12 hr tablet    Sig: TAKE 1 TABLET BY MOUTH TWICE DAILY    Dispense:  180 tablet    Refill:  3  . clonazePAM (KLONOPIN) 0.5 MG tablet    Sig: Take 1 tablet (0.5 mg total) by mouth at bedtime as needed.    Dispense:  30 tablet    Refill:  5   F/u 1-5 mos

## 2012-10-19 ENCOUNTER — Encounter: Payer: Self-pay | Admitting: Internal Medicine

## 2012-11-04 ENCOUNTER — Other Ambulatory Visit: Payer: Self-pay | Admitting: Gynecology

## 2012-11-22 ENCOUNTER — Encounter: Payer: Self-pay | Admitting: Internal Medicine

## 2012-12-30 ENCOUNTER — Other Ambulatory Visit: Payer: Self-pay

## 2013-02-08 ENCOUNTER — Encounter: Payer: Self-pay | Admitting: Internal Medicine

## 2013-02-09 ENCOUNTER — Encounter: Payer: Self-pay | Admitting: Internal Medicine

## 2013-02-09 MED ORDER — AMOXICILLIN 875 MG PO TABS: 875.0000 mg | ORAL_TABLET | Freq: Two times a day (BID) | ORAL | Status: DC

## 2013-02-22 ENCOUNTER — Ambulatory Visit (INDEPENDENT_AMBULATORY_CARE_PROVIDER_SITE_OTHER): Payer: BC Managed Care – PPO | Admitting: Internal Medicine

## 2013-02-22 VITALS — BP 120/82 | HR 87 | Temp 98.7°F | Resp 16 | Ht 64.0 in | Wt 160.0 lb

## 2013-02-22 DIAGNOSIS — J9801 Acute bronchospasm: Secondary | ICD-10-CM

## 2013-02-22 DIAGNOSIS — R05 Cough: Secondary | ICD-10-CM

## 2013-02-22 MED ORDER — PREDNISONE 20 MG PO TABS: ORAL_TABLET | ORAL | Status: DC

## 2013-02-22 MED ORDER — AZITHROMYCIN 250 MG PO TABS: ORAL_TABLET | ORAL | Status: DC

## 2013-02-22 MED ORDER — HYDROCODONE-HOMATROPINE 5-1.5 MG/5ML PO SYRP: 5.0000 mL | ORAL_SOLUTION | Freq: Four times a day (QID) | ORAL | Status: DC | PRN

## 2013-02-22 NOTE — Progress Notes (Signed)
This chart was scribed for [name], MD by Joaquin Music, ED Scribe. This patient was seen in room Room/bed 10 and the patient's care was started at 8:00 AM. Subjective:    Patient ID: Ana Griffin, female    DOB: [birth year], 47 y.o.   MRN: [id number] Chief Complaint  Patient presents with  . Cough    x 2 weeks ; concerned about walking pneumonia   Cough Associated symptoms include myalgias, shortness of breath and wheezing. Pertinent negatives include no ear pain.   Ana Griffin is a 47 y.o. female who presents to the [facility] complaining of ongoing dry/productive cough with associated fatigue and body aches that began 2 weeks ago. Pt states her symptoms initially started as sore throat and followed with body aches, fever and diaphoresis. Pt states she "does feel there is less air for her to breath when going up the stairs and walking/working". She states she has not been able to get adequate sleep. Pt states in the morning, she generally has an excess amount of phlegm. She states one of her children were recently diagnosed with pneumonia. Pt denies otalgia.    History   Social History  . Marital Status: Single    Spouse Name: N/A    Number of Children: N/A  . Years of Education: N/A   Occupational History  . Not on file.   Social History Main Topics  . Smoking status: Former Smoker    Quit date: 10/21/2003  . Smokeless tobacco: Never Used  . Alcohol Use: Yes     Comment: OCC  . Drug Use: No  . Sexual Activity: No   Other Topics Concern  . Not on file   Social History Narrative  . No narrative on file   Past Surgical History  Procedure Laterality Date  . Cervical biopsy  w/ loop electrode excision  01/2002   Family History  Problem Relation Age of Onset  . Hypertension Mother   . Diabetes Mother   . Stroke Mother   . Breast cancer Maternal Aunt   . Cancer Maternal Aunt     BRAIN   Current outpatient prescriptions:Ascorbic Acid (VITAMIN C  DROPS MT), Use as directed in the mouth or throat., Disp: , Rfl: ;  aspirin 81 MG tablet, Take 81 mg by mouth daily., Disp: , Rfl: ;  buPROPion (WELLBUTRIN SR) 150 MG 12 hr tablet, TAKE 1 TABLET BY MOUTH TWICE DAILY, Disp: 180 tablet, Rfl: 3;  clonazePAM (KLONOPIN) 0.5 MG tablet, Take 1 tablet (0.5 mg total) by mouth at bedtime as needed., Disp: 30 tablet, Rfl: 5 Cyanocobalamin (VITAMIN B 12 PO), Take by mouth., Disp: , Rfl: ;  fish oil-omega-3 fatty acids 1000 MG capsule, Take 2 g by mouth daily., Disp: , Rfl: ;  amoxicillin (AMOXIL) 875 MG tablet, Take 1 tablet (875 mg total) by mouth 2 (two) times daily., Disp: 20 tablet, Rfl: 0;  calcium carbonate (TUMS - DOSED IN MG ELEMENTAL CALCIUM) 500 MG chewable tablet, Chew 1 tablet by mouth 2 (two) times daily., Disp: , Rfl:   Review of Systems  Constitutional: Positive for fatigue.  HENT: Negative for ear pain.   Respiratory: Positive for cough, shortness of breath and wheezing.   Musculoskeletal: Positive for myalgias.   Objective:   Physical Exam  Constitutional: She is oriented to person, place, and time. She appears well-developed and well-nourished. No distress.  HENT:  Right Ear: External ear normal.  Left Ear: External ear normal.  Mouth/Throat: Oropharynx is clear and  moist.  Boggy turbinates.  Eyes: Conjunctivae and EOM are normal. Pupils are equal, round, and reactive to light.  Neck: Neck supple.  Cardiovascular: Normal rate, regular rhythm and normal heart sounds.   No murmur heard. Pulmonary/Chest: Effort normal. She has wheezes.  Mild wheezing on force expiration.  Lymphadenopathy:    She has no cervical adenopathy.  Neurological: She is alert and oriented to person, place, and time.  Skin: Skin is warm.  Psychiatric: She has a normal mood and affect. Her behavior is normal. Thought content normal.    BP 120/82  Pulse 87  Temp(Src) 98.7 F (37.1 C) (Oral)  Resp 16  Ht 5\' 4"  (1.626 m)  Wt 160 lb (72.576 kg)  BMI 27.45  kg/m2  SpO2 100% Assessment & Plan:   1. Cough   2.  Bronchospasm due to lower respiratory infection Meds ordered this encounter  Medications  . azithromycin (ZITHROMAX) 250 MG tablet    Sig: As packaged    Dispense:  6 tablet    Refill:  0  . HYDROcodone-homatropine (HYCODAN) 5-1.5 MG/5ML syrup    Sig: Take 5 mLs by mouth every 6 (six) hours as needed for cough.    Dispense:  120 mL    Refill:  0  . predniSONE (DELTASONE) 20 MG tablet    Sig: 3/3/2/2/1/1 single daily dose for 6 days    Dispense:  12 tablet    Refill:  0       I personally performed the services described in this documentation, which was scribed in my presence. The recorded information has been reviewed and is accurate.

## 2013-03-07 ENCOUNTER — Other Ambulatory Visit: Payer: Self-pay

## 2013-03-07 DIAGNOSIS — Z1231 Encounter for screening mammogram for malignant neoplasm of breast: Secondary | ICD-10-CM

## 2013-03-28 ENCOUNTER — Ambulatory Visit: Payer: BC Managed Care – PPO

## 2013-04-10 ENCOUNTER — Other Ambulatory Visit: Payer: Self-pay | Admitting: Internal Medicine

## 2013-04-11 ENCOUNTER — Ambulatory Visit
Admission: RE | Admit: 2013-04-11 | Discharge: 2013-04-11 | Disposition: A | Discharge status: Home / Self Care | Payer: BC Managed Care – PPO | Source: Ambulatory Visit

## 2013-04-11 DIAGNOSIS — Z1231 Encounter for screening mammogram for malignant neoplasm of breast: Secondary | ICD-10-CM

## 2013-05-06 ENCOUNTER — Ambulatory Visit: Payer: BC Managed Care – PPO | Admitting: Gynecology

## 2013-05-09 ENCOUNTER — Encounter: Payer: Self-pay | Admitting: Gynecology

## 2013-05-09 ENCOUNTER — Ambulatory Visit (INDEPENDENT_AMBULATORY_CARE_PROVIDER_SITE_OTHER): Payer: BC Managed Care – PPO | Admitting: Gynecology

## 2013-05-09 ENCOUNTER — Other Ambulatory Visit: Payer: Self-pay | Admitting: Internal Medicine

## 2013-05-09 VITALS — BP 110/70

## 2013-05-09 DIAGNOSIS — R102 Pelvic and perineal pain: Secondary | ICD-10-CM

## 2013-05-09 DIAGNOSIS — R350 Frequency of micturition: Secondary | ICD-10-CM

## 2013-05-09 DIAGNOSIS — R635 Abnormal weight gain: Secondary | ICD-10-CM

## 2013-05-09 DIAGNOSIS — N949 Unspecified condition associated with female genital organs and menstrual cycle: Secondary | ICD-10-CM

## 2013-05-09 DIAGNOSIS — N898 Other specified noninflammatory disorders of vagina: Secondary | ICD-10-CM

## 2013-05-09 DIAGNOSIS — M549 Dorsalgia, unspecified: Secondary | ICD-10-CM

## 2013-05-09 DIAGNOSIS — Z1159 Encounter for screening for other viral diseases: Secondary | ICD-10-CM

## 2013-05-09 LAB — WET PREP FOR TRICH, YEAST, CLUE
CLUE CELLS WET PREP: NONE SEEN
TRICH WET PREP: NONE SEEN
YEAST WET PREP: NONE SEEN

## 2013-05-09 LAB — URINALYSIS W MICROSCOPIC + REFLEX CULTURE
BILIRUBIN URINE: NEGATIVE
Casts: NONE SEEN
Crystals: NONE SEEN
GLUCOSE, UA: NEGATIVE mg/dL
KETONES UR: NEGATIVE mg/dL
Leukocytes, UA: NEGATIVE
Nitrite: NEGATIVE
Protein, ur: NEGATIVE mg/dL
Specific Gravity, Urine: 1.02 (ref 1.005–1.030)
UROBILINOGEN UA: 0.2 mg/dL (ref 0.0–1.0)
pH: 6.5 (ref 5.0–8.0)

## 2013-05-10 NOTE — Progress Notes (Signed)
   Patient presented to the office today complaining of frequent urination but she feels that she does not empty completely. She is also complaining of slight vaginal discharge with some pruritus. Patient has not been sexually active and ovary year. Patient reported normal menstrual cycles. Patient's PCP has been Dr. do a little who had recently placed on Klonopin to help her sleep. The patient has had recently several tests and her family but is coping well. She states occasional she has had some vasomotor symptoms. She had a normal [place] in 2013. Her mammogram was normal this year.  Exam: Bartholin urethra Skene was within normal limits Vagina: No lesions or discharge Cervix: No lesions or discharge Uterus anteverted normal size shape and consistency Adnexa: No palpable mass or tenderness Rectal exam: Not done  Back: No CVA tenderness Abdomen: Soft nontender no rebound guarding  Urinalysis 3-6 RBC and rare bacteria will be submitted for culture Wet prep was negative  Assessment/plan: Questionable early UTI. Because of symptoms patient will be provided with sample of Uribell 1 by mouth 4 times a day for 2 days as we wait for the results of the urine culture. Patient due for her annual exam within the next few weeks. A CBC, compress metabolic panel hepatitis C, along with TSH will be drawn and this week before her annual exam, along with fasting lipid profile.

## 2013-05-11 LAB — URINE CULTURE

## 2013-05-11 NOTE — Telephone Encounter (Signed)
faxed

## 2013-05-23 ENCOUNTER — Ambulatory Visit (INDEPENDENT_AMBULATORY_CARE_PROVIDER_SITE_OTHER): Payer: BC Managed Care – PPO | Admitting: Gynecology

## 2013-05-23 ENCOUNTER — Encounter: Payer: Self-pay | Admitting: Gynecology

## 2013-05-23 VITALS — BP 110/70 | Ht 64.0 in | Wt 161.0 lb

## 2013-05-23 DIAGNOSIS — Z01419 Encounter for gynecological examination (general) (routine) without abnormal findings: Secondary | ICD-10-CM

## 2013-05-23 LAB — CBC WITH DIFFERENTIAL/PLATELET
BASOS ABS: 0.1 10*3/uL (ref 0.0–0.1)
Basophils Relative: 1 % (ref 0–1)
Eosinophils Absolute: 0.2 10*3/uL (ref 0.0–0.7)
Eosinophils Relative: 4 % (ref 0–5)
HEMATOCRIT: 37.8 % (ref 36.0–46.0)
HEMOGLOBIN: 13 g/dL (ref 12.0–15.0)
LYMPHS PCT: 29 % (ref 12–46)
Lymphs Abs: 1.5 10*3/uL (ref 0.7–4.0)
MCH: 31 pg (ref 26.0–34.0)
MCHC: 34.4 g/dL (ref 30.0–36.0)
MCV: 90 fL (ref 78.0–100.0)
MONOS PCT: 7 % (ref 3–12)
Monocytes Absolute: 0.4 10*3/uL (ref 0.1–1.0)
NEUTROS ABS: 3.1 10*3/uL (ref 1.7–7.7)
Neutrophils Relative %: 59 % (ref 43–77)
Platelets: 270 10*3/uL (ref 150–400)
RBC: 4.2 MIL/uL (ref 3.87–5.11)
RDW: 12.8 % (ref 11.5–15.5)
WBC: 5.3 10*3/uL (ref 4.0–10.5)

## 2013-05-23 LAB — LIPID PANEL
Cholesterol: 197 mg/dL (ref 0–200)
HDL: 47 mg/dL (ref >39)
LDL CALC: 113 mg/dL — AB (ref 0–99)
TRIGLYCERIDES: 186 mg/dL — AB (ref <150)
Total CHOL/HDL Ratio: 4.2 Ratio
VLDL: 37 mg/dL (ref 0–40)

## 2013-05-23 LAB — COMPREHENSIVE METABOLIC PANEL
ALBUMIN: 4.1 g/dL (ref 3.5–5.2)
ALT: 9 U/L (ref 0–35)
AST: 15 U/L (ref 0–37)
Alkaline Phosphatase: 55 U/L (ref 39–117)
BILIRUBIN TOTAL: 0.5 mg/dL (ref 0.2–1.2)
BUN: 12 mg/dL (ref 6–23)
CO2: 28 meq/L (ref 19–32)
Calcium: 9.1 mg/dL (ref 8.4–10.5)
Chloride: 101 mEq/L (ref 96–112)
Creat: 1.01 mg/dL (ref 0.50–1.10)
GLUCOSE: 86 mg/dL (ref 70–99)
Potassium: 4 mEq/L (ref 3.5–5.3)
SODIUM: 136 meq/L (ref 135–145)
Total Protein: 6.8 g/dL (ref 6.0–8.3)

## 2013-05-23 LAB — TSH: TSH: 1.717 u[IU]/mL (ref 0.350–4.500)

## 2013-05-23 NOTE — Patient Instructions (Signed)
You had the following vaccine in 2013  Tetanus, Diphtheria (Td) Vaccine What You Need to Know WHY GET VACCINATED? Tetanus  and diphtheria are very serious diseases. They are rare in the [place] today, but people who do become infected often have severe complications. Td vaccine is used to protect adolescents and adults from both of these diseases. Both tetanus and diphtheria are infections caused by bacteria. Diphtheria spreads from person to person through coughing or sneezing. Tetanus-causing bacteria enter the body through cuts, scratches, or wounds. TETANUS (Lockjaw) causes painful muscle tightening and stiffness, usually all over the body.  It can lead to tightening of muscles in the head and neck so you can't open your mouth, swallow, or sometimes even breathe. Tetanus kills about 1 out of every 5 people who are infected. DIPHTHERIA can cause a thick coating to form in the back of the throat.  It can lead to breathing problems, paralysis, heart failure, and death. Before vaccines, the [place] saw as many as 200,000 cases a year of diphtheria and hundreds of cases of tetanus. Since vaccination began, cases of both diseases have dropped by about 99%. TD VACCINE Td vaccine can protect adolescents and adults from tetanus and diphtheria. Td is usually given as a booster dose every 10 years but it can also be given earlier after a severe and dirty wound or burn. Your doctor can give you more information. Td may safely be given at the same time as other vaccines. SOME PEOPLE SHOULD NOT GET THIS VACCINE  If you ever had a life-threatening allergic reaction after a dose of any tetanus or diphtheria containing vaccine, OR if you have a severe allergy to any part of this vaccine, you should not get Td. Tell your doctor if you have any severe allergies.  Talk to your doctor if you:  have epilepsy or another nervous system problem,  had severe pain or swelling after any vaccine  containing diphtheria or tetanus,  ever had Guillain Barr Syndrome (GBS),  aren't feeling well on the day the shot is scheduled. RISKS OF A VACCINE REACTION With a vaccine, like any medicine, there is a chance of side effects. These are usually mild and go away on their own. Serious side effects are also possible, but are very rare. Most people who get Td vaccine do not have any problems with it. Mild Problems  following Td (Did not interfere with activities)  Pain where the shot was given (about 8 people in 10)  Redness or swelling where the shot was given (about 1 person in 3)  Mild fever (about 1 person in 15)  Headache or Tiredness (uncommon) Moderate Problems following Td (Interfered with activities, but did not require medical attention)  Fever over 102 F (38.9 C) (rare) Severe Problems  following Td (Unable to perform usual activities; required medical attention)  Swelling, severe pain, bleeding, or redness in the arm where the shot was given (rare). Problems that could happen after any vaccine:  Brief fainting spells can happen after any medical procedure, including vaccination. Sitting or lying down for about 15 minutes can help prevent fainting, and injuries caused by a fall. Tell your doctor if you feel dizzy, or have vision changes or ringing in the ears.  Severe shoulder pain and reduced range of motion in the arm where a shot was given can happen, very rarely, after a vaccination.  Severe allergic reactions from a vaccine are very rare, estimated at less than 1 in a million  doses. If one were to occur, it would usually be within a few minutes to a few hours after the vaccination. WHAT IF THERE IS A SERIOUS REACTION? What should I look for?  Look for anything that concerns you, such as signs of a severe allergic reaction, very high fever, or behavior changes. Signs of a severe allergic reaction can include hives, swelling of the face and throat, difficulty  breathing, a fast heartbeat, dizziness, and weakness. These would usually start a few minutes to a few hours after the vaccination. What should I do?  If you think it is a severe allergic reaction or other emergency that can't wait, call 911 or get the person to the nearest hospital. Otherwise, call your doctor.  Afterward, the reaction should be reported to the Vaccine Adverse Event Reporting System (VAERS). Your doctor might file this report, or, you can do it yourself through the VAERS website or by calling 310-377-7393. VAERS is only for reporting reactions. They do not give medical advice. THE NATIONAL VACCINE INJURY COMPENSATION PROGRAM The National Vaccine Injury Compensation Program (VICP) is a federal program that was created to compensate people who may have been injured by certain vaccines. Persons who believe they may have been injured by a vaccine can learn about the program and about filing a claim by calling (475)469-4701 or visiting the [facility] website. HOW CAN I LEARN MORE?  Ask your doctor.  Contact your local or state health department.  Contact the Centers for Disease Control and Prevention (CDC):  Call 873-783-3014 (1-800-CDC-INFO)  Visit CDC's vaccines website CDC Td Vaccine Interim VIS (03/30/12) Document Released: 12/08/2005 Document Revised: 06/07/2012 Document Reviewed: 06/02/2012 [facility] Patient Information 2014 [place], [place].

## 2013-05-23 NOTE — Progress Notes (Signed)
Ana Griffin [birth year] [id number]   History:    48 y.o.  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 She's had normal Pap smear since then. She suffers from depression and takes Wellbutrin SR 150 mg twice a day. Patient is still overweight. Patient with 1 family history of breast cancer and with her arms. Patient stopped smoking in 2005 and had previously smoked for 15 years. Patient has not been sexually active and ovary year.   Past medical history,surgical history, family history and social history were all reviewed and documented in the EPIC chart.  Gynecologic History Patient's last menstrual period was 04/28/2013. Contraception: none Last Pap: 2013. Results were: normal Last mammogram: 2015 33-dimensional. Results were: Dense but normal  Obstetric History OB History  Gravida Para Term Preterm AB SAB TAB Ectopic Multiple Living  1 0   1     0    # Outcome Date GA Lbr Len/2nd Weight Sex Delivery Anes PTL Lv  1 ABT                ROS: A ROS was performed and pertinent positives and negatives are included in the history.  GENERAL: No fevers or chills. HEENT: No change in vision, no earache, sore throat or sinus congestion. NECK: No pain or stiffness. CARDIOVASCULAR: No chest pain or pressure. No palpitations. PULMONARY: No shortness of breath, cough or wheeze. GASTROINTESTINAL: No abdominal pain, nausea, vomiting or diarrhea, melena or bright red blood per rectum. GENITOURINARY: No urinary frequency, urgency, hesitancy or dysuria. MUSCULOSKELETAL: No joint or muscle pain, no back pain, no recent trauma. DERMATOLOGIC: No rash, no itching, no lesions. ENDOCRINE: No polyuria, polydipsia, no heat or cold intolerance. No recent change in weight. HEMATOLOGICAL: No anemia or easy bruising or bleeding. NEUROLOGIC: No headache, seizures,  numbness, tingling or weakness. PSYCHIATRIC: No depression, no loss of interest in normal activity or change in sleep pattern.     Exam: chaperone present  BP 110/70  Ht 5\' 4"  (1.626 m)  Wt 161 lb (73.029 kg)  BMI 27.62 kg/m2  LMP 04/28/2013  Body mass index is 27.62 kg/(m^2).  General appearance : Well developed well nourished female. No acute distress HEENT: Neck supple, trachea midline, no carotid bruits, no thyroidmegaly Lungs: Clear to auscultation, no rhonchi or wheezes, or rib retractions  Heart: Regular rate and rhythm, no murmurs or gallops Breast:Examined in sitting and supine position were symmetrical in appearance, no palpable masses or tenderness,  no skin retraction, no nipple inversion, no nipple discharge, no skin discoloration, no axillary or supraclavicular lymphadenopathy Abdomen: no palpable masses or tenderness, no rebound or guarding Extremities: no edema or skin discoloration or tenderness  Pelvic:  Bartholin, Urethra, Skene Glands: Within normal limits             Vagina: No gross lesions or discharge  Cervix: No gross lesions or discharge  Uterus  anteverted, normal size, shape and consistency, non-tender and mobile  Adnexa  Without masses or tenderness  Anus and perineum  normal   Rectovaginal  normal sphincter tone without palpated masses or tenderness             Hemoccult not indicated     Assessment/Plan:  48 y.o. female for annual exam who had the following labs drawn today: CBC, comprehensive metabolic panel, TSH, lipid profile, urinalysis.  New CDC guidelines is recommending patients be tested once in her lifetime for hepatitis C antibody who were born between [birth year] through 1963. This was discussed with the patient today and has agreed to be tested today.  Pap smear not done today in accordance to the new guidelines. Patient was reminded on the importance of monthly breast exam. She was also reminded on the importance of calcium and vitamin D and  regular exercise for osteoporosis prevention.  Note: This dictation was prepared with  Dragon/digital dictation along withSmart phrase technology. Any transcriptional errors that result from this process are unintentional.   Terrance Mass MD, 10:20 AM 05/23/2013

## 2013-05-23 NOTE — Addendum Note (Signed)
Addended by: Joaquin Music on: 05/23/2013 09:22 AM   Modules accepted: Orders

## 2013-05-24 ENCOUNTER — Other Ambulatory Visit: Payer: Self-pay | Admitting: Gynecology

## 2013-05-24 DIAGNOSIS — E781 Pure hyperglyceridemia: Secondary | ICD-10-CM

## 2013-05-24 LAB — URINALYSIS W MICROSCOPIC + REFLEX CULTURE
Bacteria, UA: NONE SEEN
Bilirubin Urine: NEGATIVE
Casts: NONE SEEN
Crystals: NONE SEEN
Glucose, UA: NEGATIVE mg/dL
HGB URINE DIPSTICK: NEGATIVE
Ketones, ur: NEGATIVE mg/dL
LEUKOCYTES UA: NEGATIVE
NITRITE: NEGATIVE
PH: 6 (ref 5.0–8.0)
PROTEIN: NEGATIVE mg/dL
Specific Gravity, Urine: 1.023 (ref 1.005–1.030)
Squamous Epithelial / LPF: NONE SEEN
Urobilinogen, UA: 0.2 mg/dL (ref 0.0–1.0)

## 2013-05-24 LAB — HEPATITIS C ANTIBODY: HCV Ab: NEGATIVE

## 2013-06-07 ENCOUNTER — Encounter: Payer: Self-pay | Admitting: Internal Medicine

## 2013-06-07 ENCOUNTER — Other Ambulatory Visit: Payer: Self-pay | Admitting: Physician Assistant

## 2013-06-08 MED ORDER — CLONAZEPAM 0.5 MG PO TABS: ORAL_TABLET | ORAL | Status: DC

## 2013-06-09 ENCOUNTER — Other Ambulatory Visit: Payer: Self-pay | Admitting: Radiology

## 2013-06-09 ENCOUNTER — Telehealth: Payer: Self-pay | Admitting: Family Medicine

## 2013-06-09 MED ORDER — CLONAZEPAM 0.5 MG PO TABS: ORAL_TABLET | ORAL | Status: DC

## 2013-06-09 NOTE — Telephone Encounter (Signed)
Patient came in office to pick up klonopin RX. I seen where you did RX 06/08/13 with 5 refills. I reordered RX and Dr. Everlene Farrier signed. He did 0 refill. I can call in additional refills, but wanted ok from you

## 2013-06-09 NOTE — Telephone Encounter (Signed)
Faxed Klonopin [facility]

## 2013-06-13 ENCOUNTER — Other Ambulatory Visit: Payer: Self-pay | Admitting: Internal Medicine

## 2013-06-14 NOTE — Telephone Encounter (Signed)
Called in 6 month supply

## 2013-06-14 NOTE — Telephone Encounter (Signed)
Okay to call in refills to manage a six-month supply for this medication

## 2013-09-12 ENCOUNTER — Other Ambulatory Visit: Payer: Self-pay | Admitting: Internal Medicine

## 2013-10-14 ENCOUNTER — Other Ambulatory Visit: Payer: Self-pay | Admitting: Internal Medicine

## 2013-10-27 ENCOUNTER — Other Ambulatory Visit: Payer: Self-pay | Admitting: Internal Medicine

## 2013-10-31 ENCOUNTER — Other Ambulatory Visit: Payer: Self-pay | Admitting: Physician Assistant

## 2013-10-31 NOTE — Telephone Encounter (Signed)
Called pt bc she is overdue for f/up. Pt reported that she doesn't even need a RF of this med yet and not sure why pharm keeps sending reqs. Pt did agree to call back and make appt for CPE.

## 2013-11-26 ENCOUNTER — Ambulatory Visit (INDEPENDENT_AMBULATORY_CARE_PROVIDER_SITE_OTHER): Payer: BC Managed Care – PPO | Admitting: Family Medicine

## 2013-11-26 VITALS — BP 96/68 | HR 60 | Temp 98.0°F | Resp 12 | Ht 63.75 in | Wt 157.2 lb

## 2013-11-26 DIAGNOSIS — Z2821 Immunization not carried out because of patient refusal: Secondary | ICD-10-CM

## 2013-11-26 DIAGNOSIS — F32A Depression, unspecified: Secondary | ICD-10-CM

## 2013-11-26 DIAGNOSIS — F329 Major depressive disorder, single episode, unspecified: Secondary | ICD-10-CM

## 2013-11-26 DIAGNOSIS — G4700 Insomnia, unspecified: Secondary | ICD-10-CM

## 2013-11-26 MED ORDER — CLONAZEPAM 0.5 MG PO TABS: 0.5000 mg | ORAL_TABLET | Freq: Two times a day (BID) | ORAL | Status: DC | PRN

## 2013-11-26 MED ORDER — BUPROPION HCL ER (SR) 150 MG PO TB12: 150.0000 mg | ORAL_TABLET | Freq: Two times a day (BID) | ORAL | Status: DC

## 2013-11-26 NOTE — Progress Notes (Signed)
 Chief Complaint:  Chief Complaint  Patient presents with  . Medication Refill    wellbutrin    HPI: Ana Griffin is a 48 y.o. female who is here for  wellbutrin and klonopin  refills She is doing well on the medicines Lost both parents and 2 other friends in a 10 month period and was dealing with that, she stillhas moments where she will break down in tears but those are less now .Her father died 2 years ago this month and mom was 1 year ago, and her GM and dear friend withoin 10 months of her mother's death She has been taking her wellbutrin as prescribed, she has been taking her klonopin sometimes during the day when she has a moment of distress about thinking about her parents  and she uses 1 mg at night prn, she has had several tiems a month where  she is take the klonopin one in the daytime in order to get through her emotions., she has not gone to therapy    She is doing well on the wellbutrin. She tried weaning off of it and ended up having w flu like sxs and so figured it might have been withdrawal sxs and so started taking it again.   No thoughts of SI/HI/halluciantions Good support sxs   Past Medical History  Diagnosis Date  . 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 Depression    Past Surgical History  Procedure Laterality Date  . Cervical biopsy  w/ loop electrode excision  01/2002  . Colposcopy     History   Social History  . Marital Status: Single    Spouse Name: N/A    Number of Children: N/A  . Years of Education: N/A   Social History Main Topics  . Smoking status: Former Smoker    Quit date: 10/21/2003  . Smokeless tobacco: Never Used  . Alcohol Use: Yes     Comment: OCC  . Drug Use: No  . Sexual Activity: No   Other Topics Concern  . None   Social History Narrative  . None   Family History  Problem Relation Age of Onset  . Hypertension Mother   . Diabetes Mother   . Stroke Mother   . COPD Mother   . Heart failure  Mother   . Breast cancer Maternal Aunt 52  . Cancer Maternal Aunt     BRAIN  . Cancer Father     Lung cancer   No Known Allergies Prior to Admission medications   Medication Sig Start Date End Date Taking? Authorizing Provider  Ascorbic Acid (VITAMIN C) 1000 MG tablet Take 1,000 mg by mouth daily.   Yes Historical Provider, MD  aspirin 81 MG tablet Take 81 mg by mouth daily.   Yes Historical Provider, MD  BIOTIN PO Take 1 tablet by mouth daily.   Yes Historical Provider, MD  buPROPion (WELLBUTRIN SR) 150 MG 12 hr tablet Take 1 tablet (150 mg total) by mouth 2 (two) times daily. NO MORE REFILLS WITHOUT OFFICE VISIT - FINAL NOTICE 10/27/13  Yes Mancel Bale, PA-C  Calcium Carb-Cholecalciferol (CALCIUM 1000 + D PO) Take 1 tablet by mouth daily.   Yes Historical Provider, MD  clonazePAM (KLONOPIN) 0.5 MG tablet TAKE 1 TABLET BY MOUTH EVERY NIGHT AT BEDTIME AS NEEDED 06/09/13  Yes Darlyne Russian, MD  Cyanocobalamin (VITAMIN B 12 PO) Take by mouth.   Yes Historical Provider, MD  fish oil-omega-3 fatty acids 1000 MG capsule Take 2 g by mouth daily.   Yes Historical Provider, MD     ROS: The patient denies fevers, chills, night sweats, unintentional weight loss, chest pain, palpitations, wheezing, dyspnea on exertion, nausea, vomiting, abdominal pain, dysuria, hematuria, melena, numbness, weakness, or tingling.   All other systems have been reviewed and were otherwise negative with the exception of those mentioned in the HPI and as above.    PHYSICAL EXAM: Filed Vitals:   11/26/13 1512  BP: 96/68  Pulse: 60  Temp: 98 F (36.7 C)  Resp: 12   Filed Vitals:   11/26/13 1512  Height: 5' 3.75" (1.619 m)  Weight: 157 lb 4 oz (71.328 kg)   Body mass index is 27.21 kg/(m^2).  General: Alert, no acute distress HEENT:  Normocephalic, atraumatic, oropharynx patent. EOMI, PERRLA Cardiovascular:  Regular rate and rhythm, no rubs murmurs or gallops.  No Carotid bruits, radial pulse intact. No pedal  edema.  Respiratory: Clear to auscultation bilaterally.  No wheezes, rales, or rhonchi.  No cyanosis, no use of accessory musculature GI: No organomegaly, abdomen is soft and non-tender, positive bowel sounds.  No masses. Skin: No rashes. Neurologic: Facial musculature symmetric. Psychiatric: Patient is appropriate throughout our interaction. Lymphatic: No cervical lymphadenopathy Musculoskeletal: Gait intact.   LABS: Results for orders placed in visit on 05/09/13  WET PREP FOR [place], YEAST, CLUE      Result Value Ref Range   Yeast Wet Prep HPF POC NONE SEEN  NONE SEEN   Trich, Wet Prep NONE SEEN  NONE SEEN   Clue Cells Wet Prep HPF POC NONE SEEN  NONE SEEN   WBC, Wet Prep HPF POC RARE  NONE SEEN  URINE CULTURE      Result Value Ref Range   Colony Count 25,000 COLONIES/ML     Organism ID, Bacteria Multiple bacterial morphotypes present, none     Organism ID, Bacteria predominant. Suggest appropriate recollection if      Organism ID, Bacteria clinically indicated.    URINALYSIS W MICROSCOPIC + REFLEX CULTURE      Result Value Ref Range   Color, Urine YELLOW  YELLOW   APPearance CLEAR  CLEAR   Specific Gravity, Urine 1.020  1.005 - 1.030   pH 6.5  5.0 - 8.0   Glucose, UA NEG  NEG mg/dL   Bilirubin Urine NEG  NEG   Ketones, ur NEG  NEG mg/dL   Hgb urine dipstick TRACE (*) NEG   Protein, ur NEG  NEG mg/dL   Urobilinogen, UA 0.2  0.0 - 1.0 mg/dL   Nitrite NEG  NEG   Leukocytes, UA NEG  NEG   Squamous Epithelial / LPF FEW  RARE   Crystals NONE SEEN  NONE SEEN   Casts NONE SEEN  NONE SEEN   WBC, UA 0-2  <3 WBC/hpf   RBC / HPF 3-6 (*) <3 RBC/hpf   Bacteria, UA RARE  RARE  LIPID PANEL      Result Value Ref Range   Cholesterol 197  0 - 200 mg/dL   Triglycerides 186 (*) <150 mg/dL   HDL 47  >39 mg/dL   Total CHOL/HDL Ratio 4.2     VLDL 37  0 - 40 mg/dL   LDL Cholesterol 113 (*) 0 - 99 mg/dL  CBC WITH DIFFERENTIAL      Result Value Ref Range   WBC 5.3  4.0 - 10.5 K/uL   RBC  4.20  3.87 -  5.11 MIL/uL   Hemoglobin 13.0  12.0 - 15.0 g/dL   HCT 37.8  36.0 - 46.0 %   MCV 90.0  78.0 - 100.0 fL   MCH 31.0  26.0 - 34.0 pg   MCHC 34.4  30.0 - 36.0 g/dL   RDW 12.8  11.5 - 15.5 %   Platelets 270  150 - 400 K/uL   Neutrophils Relative % 59  43 - 77 %   Neutro Abs 3.1  1.7 - 7.7 K/uL   Lymphocytes Relative 29  12 - 46 %   Lymphs Abs 1.5  0.7 - 4.0 K/uL   Monocytes Relative 7  3 - 12 %   Monocytes Absolute 0.4  0.1 - 1.0 K/uL   Eosinophils Relative 4  0 - 5 %   Eosinophils Absolute 0.2  0.0 - 0.7 K/uL   Basophils Relative 1  0 - 1 %   Basophils Absolute 0.1  0.0 - 0.1 K/uL   Smear Review Criteria for review not met    COMPREHENSIVE METABOLIC PANEL      Result Value Ref Range   Sodium 136  135 - 145 mEq/L   Potassium 4.0  3.5 - 5.3 mEq/L   Chloride 101  96 - 112 mEq/L   CO2 28  19 - 32 mEq/L   Glucose, Bld 86  70 - 99 mg/dL   BUN 12  6 - 23 mg/dL   Creat 1.01  0.50 - 1.10 mg/dL   Total Bilirubin 0.5  0.2 - 1.2 mg/dL   Alkaline Phosphatase 55  39 - 117 U/L   AST 15  0 - 37 U/L   ALT 9  0 - 35 U/L   Total Protein 6.8  6.0 - 8.3 g/dL   Albumin 4.1  3.5 - 5.2 g/dL   Calcium 9.1  8.4 - 10.5 mg/dL  TSH      Result Value Ref Range   TSH 1.717  0.350 - 4.500 uIU/mL  URINALYSIS W MICROSCOPIC + REFLEX CULTURE      Result Value Ref Range   Color, Urine YELLOW  YELLOW   APPearance CLEAR  CLEAR   Specific Gravity, Urine 1.023  1.005 - 1.030   pH 6.0  5.0 - 8.0   Glucose, UA NEG  NEG mg/dL   Bilirubin Urine NEG  NEG   Ketones, ur NEG  NEG mg/dL   Hgb urine dipstick NEG  NEG   Protein, ur NEG  NEG mg/dL   Urobilinogen, UA 0.2  0.0 - 1.0 mg/dL   Nitrite NEG  NEG   Leukocytes, UA NEG  NEG   Squamous Epithelial / LPF NONE SEEN  RARE   Crystals NONE SEEN  NONE SEEN   Casts NONE SEEN  NONE SEEN   WBC, UA 0-2  <3 WBC/hpf   RBC / HPF 0-2  <3 RBC/hpf   Bacteria, UA NONE SEEN  RARE  HEPATITIS C ANTIBODY      Result Value Ref Range   HCV Ab NEGATIVE  NEGATIVE      EKG/XRAY:   Primary read interpreted by Dr. Marin Comment at [facility].   ASSESSMENT/PLAN: Encounter Diagnoses  Name Primary?  . Depression Yes  . Insomnia   . Influenza vaccination declined    Refill Klonopin Refill Wellbutrin F/u prn otherwise in 6 months  Gross sideeffects, risk and benefits, and alternatives of medications d/w patient. Patient is aware that all medications have potential sideeffects and we are unable to predict every sideeffect or  drug-drug interaction that may occur.  , [place], DO 11/26/2013 3:52 PM

## 2014-06-23 ENCOUNTER — Other Ambulatory Visit: Payer: Self-pay | Admitting: Family Medicine

## 2014-06-26 ENCOUNTER — Telehealth: Payer: Self-pay | Admitting: Family Medicine

## 2014-06-26 NOTE — Telephone Encounter (Signed)
[place] controlled substance db pulled, no illegal activities.

## 2014-06-28 NOTE — Telephone Encounter (Signed)
Faxed Rx. Pt aware by mychart.

## 2014-07-14 ENCOUNTER — Ambulatory Visit: Payer: Self-pay | Admitting: Family Medicine

## 2014-08-22 ENCOUNTER — Other Ambulatory Visit: Payer: Self-pay

## 2014-08-22 DIAGNOSIS — Z1231 Encounter for screening mammogram for malignant neoplasm of breast: Secondary | ICD-10-CM

## 2014-09-08 ENCOUNTER — Ambulatory Visit (INDEPENDENT_AMBULATORY_CARE_PROVIDER_SITE_OTHER): Payer: BC Managed Care – PPO | Admitting: Internal Medicine

## 2014-09-08 VITALS — BP 102/74 | HR 62 | Temp 98.8°F | Resp 14 | Ht 63.5 in | Wt 159.4 lb

## 2014-09-08 DIAGNOSIS — R5383 Other fatigue: Secondary | ICD-10-CM | POA: Diagnosis not present

## 2014-09-08 DIAGNOSIS — F329 Major depressive disorder, single episode, unspecified: Secondary | ICD-10-CM

## 2014-09-08 DIAGNOSIS — B351 Tinea unguium: Secondary | ICD-10-CM

## 2014-09-08 DIAGNOSIS — Z833 Family history of diabetes mellitus: Secondary | ICD-10-CM | POA: Diagnosis not present

## 2014-09-08 DIAGNOSIS — R350 Frequency of micturition: Secondary | ICD-10-CM

## 2014-09-08 DIAGNOSIS — F32A Depression, unspecified: Secondary | ICD-10-CM

## 2014-09-08 DIAGNOSIS — E7800 Pure hypercholesterolemia, unspecified: Secondary | ICD-10-CM

## 2014-09-08 LAB — CBC WITH DIFFERENTIAL/PLATELET
Basophils Absolute: 0.1 10*3/uL (ref 0.0–0.1)
Basophils Relative: 1 % (ref 0–1)
EOS ABS: 0.1 10*3/uL (ref 0.0–0.7)
Eosinophils Relative: 2 % (ref 0–5)
HEMATOCRIT: 40.6 % (ref 36.0–46.0)
HEMOGLOBIN: 13.5 g/dL (ref 12.0–15.0)
LYMPHS ABS: 1.8 10*3/uL (ref 0.7–4.0)
Lymphocytes Relative: 33 % (ref 12–46)
MCH: 30.1 pg (ref 26.0–34.0)
MCHC: 33.3 g/dL (ref 30.0–36.0)
MCV: 90.6 fL (ref 78.0–100.0)
MPV: 10.7 fL (ref 8.6–12.4)
Monocytes Absolute: 0.4 10*3/uL (ref 0.1–1.0)
Monocytes Relative: 8 % (ref 3–12)
NEUTROS ABS: 3.1 10*3/uL (ref 1.7–7.7)
NEUTROS PCT: 56 % (ref 43–77)
PLATELETS: 276 10*3/uL (ref 150–400)
RBC: 4.48 MIL/uL (ref 3.87–5.11)
RDW: 12.5 % (ref 11.5–15.5)
WBC: 5.5 10*3/uL (ref 4.0–10.5)

## 2014-09-08 LAB — POCT UA - MICROSCOPIC ONLY
CRYSTALS, UR, HPF, POC: NEGATIVE
Casts, Ur, LPF, POC: NEGATIVE
Mucus, UA: NEGATIVE
Yeast, UA: NEGATIVE

## 2014-09-08 LAB — POCT URINALYSIS DIPSTICK
BILIRUBIN UA: NEGATIVE
GLUCOSE UA: NEGATIVE
KETONES UA: NEGATIVE
LEUKOCYTES UA: NEGATIVE
Nitrite, UA: NEGATIVE
PROTEIN UA: NEGATIVE
Spec Grav, UA: 1.02
UROBILINOGEN UA: 0.2
pH, UA: 7

## 2014-09-08 LAB — COMPREHENSIVE METABOLIC PANEL
ALBUMIN: 4.3 g/dL (ref 3.5–5.2)
ALT: 8 U/L (ref 0–35)
AST: 14 U/L (ref 0–37)
Alkaline Phosphatase: 57 U/L (ref 39–117)
BILIRUBIN TOTAL: 0.7 mg/dL (ref 0.2–1.2)
BUN: 12 mg/dL (ref 6–23)
CALCIUM: 9.2 mg/dL (ref 8.4–10.5)
CO2: 27 mEq/L (ref 19–32)
CREATININE: 0.93 mg/dL (ref 0.50–1.10)
Chloride: 104 mEq/L (ref 96–112)
GLUCOSE: 77 mg/dL (ref 70–99)
Potassium: 4.5 mEq/L (ref 3.5–5.3)
Sodium: 138 mEq/L (ref 135–145)
Total Protein: 7.1 g/dL (ref 6.0–8.3)

## 2014-09-08 LAB — LIPID PANEL
Cholesterol: 208 mg/dL — ABNORMAL HIGH (ref 0–200)
HDL: 49 mg/dL (ref >=46)
LDL Cholesterol: 139 mg/dL — ABNORMAL HIGH (ref 0–99)
TRIGLYCERIDES: 102 mg/dL (ref <150)
Total CHOL/HDL Ratio: 4.2 Ratio
VLDL: 20 mg/dL (ref 0–40)

## 2014-09-08 LAB — TSH: TSH: 1.068 u[IU]/mL (ref 0.350–4.500)

## 2014-09-08 LAB — T4, FREE: Free T4: 0.83 ng/dL (ref 0.80–1.80)

## 2014-09-08 LAB — POCT GLYCOSYLATED HEMOGLOBIN (HGB A1C): Hemoglobin A1C: 5.5

## 2014-09-08 MED ORDER — BUPROPION HCL ER (SR) 150 MG PO TB12: 150.0000 mg | ORAL_TABLET | Freq: Two times a day (BID) | ORAL | Status: DC

## 2014-09-08 MED ORDER — V-C FORTE PO CAPS: ORAL_CAPSULE | ORAL | Status: DC

## 2014-09-08 MED ORDER — CLONAZEPAM 0.5 MG PO TABS: ORAL_TABLET | ORAL | Status: DC

## 2014-09-08 MED ORDER — TERBINAFINE HCL 250 MG PO TABS: 250.0000 mg | ORAL_TABLET | Freq: Every day | ORAL | Status: DC

## 2014-09-08 NOTE — Progress Notes (Signed)
Subjective:    Patient ID: Ana Griffin, female    DOB: 1965/06/15, 49 y.o.   MRN: [id number] This chart was scribed for Tami Lin, MD by Zola Button, Medical Scribe. This patient was seen in Room 3 and the patient's care was started at 1:44 PM.   HPI HPI Comments: Ana Griffin is a 49 y.o. female with a history of depression, anxiety, and insomnia who presents to the [facility] with multiple complaints. Symptoms include abdominal bloating, nausea, diaphoresis, intermittent tongue burning sensation, urinary frequency, fatigue, upper back pain, and issues with her hair/skin/nails. She would like to be checked for diabetes because her FMHx includes DM.  Abdominal Bloating: Patient reports having intermittent abdominal bloating for several months. This seems to occur sporadically and does not seem to be related to eating. She fasted today and still has abdominal bloating. She has also noticed some weight gain. Patient denies acid reflux and SOB.  Depression: Patient is on still on Wellbutrin SR 150 mg BID; she denies side effects with this. She does do things for fun and spends time with young people. She has been taking Klonopin intermittently, as much as twice a day for anxiety and insomnia, and she is concerned about addiction. She has been off of this for the last month or 2 but has significant control falling asleep 5 nights out of 7. She perceives life has significantly stressful  Back Pain: Patient has been having some flare-ups of upper back pain. She "threw out" her back 2-3 years ago and has been having flare-ups occasionally, although it is usually her lower back that is affected. More recently, she has been having worse and more frequent flare-ups in her upper back. She has taken her dog's tramadol for the pain with some success. She is not very active and has not followed past advice to begin yoga  Oily Skin/Hair: Patient notes that her skin and hair have  been more oily over the past 1-2 weeks.   Urinary Frequency/Urgency: Patient reports having some intermittent urinary frequency and urinary urgency. She seems to only have these symptoms on certain days.   Diaphoresis: Patient has been having diaphoresis along with alternating feelings of hot and cold. She still has regular menstrual periods.  Stye/Adenopathy: She previously had a a stye in her right eye along with a swollen lymph node on the right side of her neck. These have resolved.  Toenail Thickening: She reports having some thickening of her right toenails. She feels as if her toenails are deteriorating. She does not have a podiatrist currently.  Rash: Patient was bit in her left 3rd finger 2 days ago. She is unsure what she was bitten by, but she thinks it could have been a spider. She noticed swelling and itchiness to the area today.  Brittle Hair: She has been having some hair brittleness.  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 Has gyn  Review of Systems  Constitutional: Negative for fever and unexpected weight change.       She has fatigue only when she has sleep deprivation  Respiratory: Negative for shortness of breath and wheezing.   Cardiovascular: Negative for chest pain, palpitations and leg swelling.  Gastrointestinal: Negative for abdominal pain.  Genitourinary: Negative for difficulty urinating.       She describes an intermittent frequency but no nocturia  Neurological: Negative for headaches.  Hematological: Does not bruise/bleed easily.  Psychiatric/Behavioral:       No daytime hypersomnolence or nocturnal  snoring or paroxysmal nocturnal dyspnea       Objective:   Physical Exam  Constitutional: She is oriented to person, place, and time. She appears well-developed and well-nourished. No distress.  HENT:  Head: Normocephalic and atraumatic.  Nose: Nose normal.  Mouth/Throat: Oropharynx is clear and moist. No oropharyngeal exudate.  Eyes: Conjunctivae  and EOM are normal. Pupils are equal, round, and reactive to light.  Neck: Neck supple. No thyromegaly present.  Cardiovascular: Normal rate, regular rhythm, normal heart sounds and intact distal pulses.   No murmur heard. Pulmonary/Chest: Effort normal and breath sounds normal.  Abdominal: Soft. Bowel sounds are normal. She exhibits no distension and no mass. There is no tenderness. There is no rebound and no guarding.  Musculoskeletal: Normal range of motion. She exhibits no edema.  She is not specifically tender in the lumbar area nor does she have positive straight leg raise but has some muscle tightness. She also is tender over the left scapular border area into the trapezius but the shoulder has a full range of motion. Neck flexion exposes a tightness in the right trapezius.  Lymphadenopathy:    She has no cervical adenopathy.  Neurological: She is alert and oriented to person, place, and time. No cranial nerve deficit.  Skin: Skin is warm and dry. No rash noted.  She has fungal changes on nails 14 and 5 of the right foot with tenderness especially the fourth nail bed which is very distorted. There are minimal changes on toes 1 and 5 on the left foot.  Psychiatric: She has a normal mood and affect. Her behavior is normal. Judgment and thought content normal.  Vitals reviewed. BP 102/74 mmHg  Pulse 62  Temp(Src) 98.8 F (37.1 C) (Oral)  Resp 14  Ht 5' 3.5" (1.613 m)  Wt 159 lb 6.4 oz (72.303 kg)  BMI 27.79 kg/m2  SpO2 96%  LMP 08/05/2014       Assessment & Plan:  Other fatigue - Plan: CBC with Differential/Platelet, Comprehensive metabolic panel, TSH, T4, free  Onychomycosis - Plan: POCT glycosylated hemoglobin (Hb A1C)  Elevated cholesterol - Plan: Lipid panel  Family history of diabetes mellitus in brother - Plan: POCT glycosylated hemoglobin (Hb A1C)  Frequency of urination - Plan: POCT urinalysis dipstick, POCT UA - Microscopic Only  Depression - Plan: buPROPion  (WELLBUTRIN SR) 150 MG 12 hr tablet bid  To begin alternative mind control behaviors for insomnia but is instructed to use clonazepam as often as 5 nights a  week if needed to prevent sleep deprivation  Trapezius spasm right-once again she is asked to begin a yoga program for overall fitness and flexibility  Meds ordered this encounter  Medications  . clonazePAM (KLONOPIN) 0.5 MG tablet    Sig: TAKE 1 TABLET BY MOUTH TWICE DAILY AS NEEDED FOR ANXIETY OR INSOMNIA    Dispense:  60 tablet    Refill:  5  . terbinafine (LAMISIL) 250 MG tablet    Sig: Take 1 tablet (250 mg total) by mouth daily.    Dispense:  30 tablet    Refill:  2  . buPROPion (WELLBUTRIN SR) 150 MG 12 hr tablet    Sig: Take 1 tablet (150 mg total) by mouth 2 (two) times daily.    Dispense:  180 tablet    Refill:  3   She will continue her vitamins: B complex, vitamin D and calcium, vitamin C  I have completed the patient encounter in its entirety as documented by the  scribe, with editing by me where necessary. Robert P. Laney Pastor, M.D.  Addendum laboratory 09/09/2014 Results for orders placed or performed in visit on 09/08/14  CBC with Differential/Platelet  Result Value Ref Range   WBC 5.5 4.0 - 10.5 K/uL   RBC 4.48 3.87 - 5.11 MIL/uL   Hemoglobin 13.5 12.0 - 15.0 g/dL   HCT 40.6 36.0 - 46.0 %   MCV 90.6 78.0 - 100.0 fL   MCH 30.1 26.0 - 34.0 pg   MCHC 33.3 30.0 - 36.0 g/dL   RDW 12.5 11.5 - 15.5 %   Platelets 276 150 - 400 K/uL   MPV 10.7 8.6 - 12.4 fL   Neutrophils Relative % 56 43 - 77 %   Neutro Abs 3.1 1.7 - 7.7 K/uL   Lymphocytes Relative 33 12 - 46 %   Lymphs Abs 1.8 0.7 - 4.0 K/uL   Monocytes Relative 8 3 - 12 %   Monocytes Absolute 0.4 0.1 - 1.0 K/uL   Eosinophils Relative 2 0 - 5 %   Eosinophils Absolute 0.1 0.0 - 0.7 K/uL   Basophils Relative 1 0 - 1 %   Basophils Absolute 0.1 0.0 - 0.1 K/uL   Smear Review Criteria for review not met   Comprehensive metabolic panel  Result Value Ref Range     Sodium 138 135 - 145 mEq/L   Potassium 4.5 3.5 - 5.3 mEq/L   Chloride 104 96 - 112 mEq/L   CO2 27 19 - 32 mEq/L   Glucose, Bld 77 70 - 99 mg/dL   BUN 12 6 - 23 mg/dL   Creat 0.93 0.50 - 1.10 mg/dL   Total Bilirubin 0.7 0.2 - 1.2 mg/dL   Alkaline Phosphatase 57 39 - 117 U/L   AST 14 0 - 37 U/L   ALT 8 0 - 35 U/L   Total Protein 7.1 6.0 - 8.3 g/dL   Albumin 4.3 3.5 - 5.2 g/dL   Calcium 9.2 8.4 - 10.5 mg/dL  TSH  Result Value Ref Range   TSH 1.068 0.350 - 4.500 uIU/mL  Lipid panel  Result Value Ref Range   Cholesterol 208 (H) 0 - 200 mg/dL   Triglycerides 102 <150 mg/dL   HDL 49 >=46 mg/dL   Total CHOL/HDL Ratio 4.2 Ratio   VLDL 20 0 - 40 mg/dL   LDL Cholesterol 139 (H) 0 - 99 mg/dL  T4, free  Result Value Ref Range   Free T4 0.83 0.80 - 1.80 ng/dL  POCT glycosylated hemoglobin (Hb A1C)  Result Value Ref Range   Hemoglobin A1C 5.5   POCT urinalysis dipstick  Result Value Ref Range   Color, UA yellow    Clarity, UA clear    Glucose, UA neg    Bilirubin, UA neg    Ketones, UA neg    Spec Grav, UA 1.020    Blood, UA small    pH, UA 7.0    Protein, UA neg    Urobilinogen, UA 0.2    Nitrite, UA neg    Leukocytes, UA Negative Negative  POCT UA - Microscopic Only  Result Value Ref Range   WBC, Ur, HPF, POC 0-1    RBC, urine, microscopic 0-2    Bacteria, U Microscopic trace    Mucus, UA neg    Epithelial cells, urine per micros 0-2    Crystals, Ur, HPF, POC neg    Casts, Ur, LPF, POC neg    Yeast, UA neg

## 2014-09-21 ENCOUNTER — Ambulatory Visit
Admission: RE | Admit: 2014-09-21 | Discharge: 2014-09-21 | Disposition: A | Discharge status: Home / Self Care | Payer: BC Managed Care – PPO | Source: Ambulatory Visit

## 2014-09-21 DIAGNOSIS — Z1231 Encounter for screening mammogram for malignant neoplasm of breast: Secondary | ICD-10-CM

## 2014-10-25 ENCOUNTER — Encounter: Payer: Self-pay | Admitting: Internal Medicine

## 2014-12-05 ENCOUNTER — Other Ambulatory Visit: Payer: Self-pay | Admitting: Internal Medicine

## 2015-01-22 ENCOUNTER — Encounter: Payer: Self-pay | Admitting: Internal Medicine

## 2015-03-03 ENCOUNTER — Other Ambulatory Visit: Payer: Self-pay | Admitting: Internal Medicine

## 2015-03-06 NOTE — Telephone Encounter (Signed)
Faxed

## 2015-04-04 ENCOUNTER — Ambulatory Visit (INDEPENDENT_AMBULATORY_CARE_PROVIDER_SITE_OTHER): Payer: BC Managed Care – PPO | Admitting: Family Medicine

## 2015-04-04 VITALS — BP 118/72 | HR 86 | Temp 98.7°F | Resp 16 | Ht 64.0 in | Wt 158.0 lb

## 2015-04-04 DIAGNOSIS — F329 Major depressive disorder, single episode, unspecified: Secondary | ICD-10-CM

## 2015-04-04 DIAGNOSIS — F32A Depression, unspecified: Secondary | ICD-10-CM

## 2015-04-04 MED ORDER — BUPROPION HCL ER (SR) 200 MG PO TB12: 200.0000 mg | ORAL_TABLET | Freq: Two times a day (BID) | ORAL | Status: DC

## 2015-04-04 MED ORDER — CLONAZEPAM 0.5 MG PO TABS: 0.5000 mg | ORAL_TABLET | Freq: Three times a day (TID) | ORAL | Status: DC | PRN
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
## 2015-05-16 ENCOUNTER — Encounter: Payer: Self-pay | Admitting: Internal Medicine

## 2015-05-16 ENCOUNTER — Ambulatory Visit (INDEPENDENT_AMBULATORY_CARE_PROVIDER_SITE_OTHER): Payer: BC Managed Care – PPO | Admitting: Internal Medicine

## 2015-05-16 VITALS — BP 102/64 | HR 66 | Ht 64.0 in | Wt 156.4 lb

## 2015-05-16 DIAGNOSIS — R079 Chest pain, unspecified: Secondary | ICD-10-CM | POA: Diagnosis not present

## 2015-05-16 NOTE — Patient Instructions (Addendum)
Your physician has requested that you have an exercise tolerance test. For further information please visit [email or web address]. Please also follow instruction sheet, as given. -- this is done in Dr. Lysbeth Penner office, every day except Mondays  Your physician has ordered a coronary calcium score - a CT test  -- this is done at [address]  Your physician recommends that you schedule a follow-up appointment after your testing   Coronary Calcium Scan A coronary calcium scan is an imaging test used to look for deposits of calcium and other fatty materials (plaques) in the inner lining of the blood vessels of your heart (coronary arteries). These deposits of calcium and plaques can partly clog and narrow the coronary arteries without producing any symptoms or warning signs. This puts you at risk for a heart attack. This test can detect these deposits before symptoms develop.  LET [facility] CARE PROVIDER KNOW ABOUT:  Any allergies you have.  All medicines you are taking, including vitamins, herbs, eye drops, creams, and over-the-counter medicines.  Previous problems you or members of your family have had with the use of anesthetics.  Any blood disorders you have.  Previous surgeries you have had.  Medical conditions you have.  Possibility of pregnancy, if this applies. RISKS AND COMPLICATIONS Generally, this is a safe procedure. However, as with any procedure, complications can occur. This test involves the use of radiation. Radiation exposure can be dangerous to a pregnant woman and her unborn baby. If you are pregnant, you should not have this procedure done.  BEFORE THE PROCEDURE There is no special preparation for the procedure. PROCEDURE  You will need to undress and put on a hospital gown. You will need to remove any jewelry around your neck or chest.  Sticky electrodes are placed on your chest and are connected to an electrocardiogram (EKG or electrocardiography) machine  to recorda tracing of the electrical activity of your heart.  A CT scanner will take pictures of your heart. During this time, you will be asked to lie still and hold your breath for 2-3 seconds while a picture is being taken of your heart. AFTER THE PROCEDURE   You will be allowed to get dressed.  You can return to your normal activities after the scan is done.   This information is not intended to replace advice given to you by your health care provider. Make sure you discuss any questions you have with your health care provider.   Document Released: 08/09/2007 Document Revised: 02/15/2013 Document Reviewed: 10/18/2012 Elsevier Interactive Patient Education 2016 [employer].   Exercise Stress Electrocardiogram An exercise stress electrocardiogram is a test to check how blood flows to your heart. It is done to find areas of poor blood flow. You will need to walk on a treadmill for this test. The electrocardiogram will record your heartbeat when you are at rest and when you are exercising. BEFORE THE PROCEDURE  Do not have drinks with caffeine or foods with caffeine for 24 hours before the test, or as told by your doctor. This includes coffee, tea (even decaf tea), sodas, chocolate, and cocoa.  Follow your doctor's instructions about eating and drinking before the test.  Ask your doctor what medicines you should or should not take before the test. Take your medicines with water unless told by your doctor not to.  If you use an inhaler, bring it with you to the test.  Bring a snack to eat after the test.  Do not  smoke for 4 hours before the test.  Do not put lotions, powders, creams, or oils on your chest before the test.  Wear comfortable shoes and clothing. PROCEDURE  You will have patches put on your chest. Small areas of your chest may need to be shaved. Wires will be connected to the patches.  Your heart rate will be watched while you are resting and while you are  exercising.  You will walk on the treadmill. The treadmill will slowly get faster to raise your heart rate.  The test will take about 1-2 hours. AFTER THE PROCEDURE  Your heart rate and blood pressure will be watched after the test.  You may return to your normal diet, activities, and medicines or as told by your doctor.   This information is not intended to replace advice given to you by your health care provider. Make sure you discuss any questions you have with your health care provider.   Document Released: 07/30/2007 Document Revised: 03/03/2014 Document Reviewed: 10/18/2012 Elsevier Interactive Patient Education [employer].

## 2015-05-16 NOTE — Progress Notes (Signed)
Patient ID: Ana Griffin, female   DOB: Jun 27, 1965, 50 y.o.   MRN: [id number]    OFFICE NOTE  Chief Complaint:  Chest Discomfort  Primary Care Physician: Leandrew Koyanagi, MD  HPI:  Ana Griffin is a 50 y.o. female with hx of Depression, Insomnia. She presents today as a self-referral due to recent development of chest discomfort / pain. She says that she has had discomfort on the left side of her chest that occurs at any time, not with exertion. The feeling lasts minutes to around 2 hours, and resolves with time. She has not had shortness of breath. No nausea, or diaphoresis during these episodes. She has anecdotally experienced five separate episodes of nausea. One month ago she developed one episode of left arm pain that resolved. Her chest was not tender to palpation. She denies lower extremity edema, or orthopnea. She says she has never had acid reflux, belching, or needed to take medicine for stomach acid. She became concerned that the constellation of these symptoms may be cardiac related.   No family history of early MI. Mom with CHF diagnosed at an early age - 26. She does not exercise very frequently. She tries to live as healthily as she can. Under significant stress with the death of multiple family members over the past year, and recently had a tree fall on her house and destroy most of it.   PMHx:  Past Medical History  Diagnosis Date  . 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 Depression     Past Surgical History  Procedure Laterality Date  . Cervical biopsy  w/ loop electrode excision  01/2002  . Colposcopy      FAMHx:  Family History  Problem Relation Age of Onset  . Hypertension Mother   . Diabetes Mother   . Stroke Mother   . COPD Mother   . Heart failure Mother   . Breast cancer Maternal Aunt 52  . Cancer Maternal Aunt     BRAIN  . Cancer Father     Lung cancer    SOCHx:   reports that she quit smoking about 11 years ago. She  has never used smokeless tobacco. She reports that she drinks alcohol. She reports that she does not use illicit drugs.  ALLERGIES:  No Known Allergies  ROS: Pertinent items are noted in HPI.  HOME MEDS: Current Outpatient Prescriptions  Medication Sig Dispense Refill  . Ascorbic Acid (VITAMIN C) 1000 MG tablet Take 1,000 mg by mouth daily.    Marland Kitchen aspirin 81 MG tablet Take 81 mg by mouth daily.    Marland Kitchen BIOTIN PO Take 1 tablet by mouth daily.    Marland Kitchen buPROPion (WELLBUTRIN SR) 200 MG 12 hr tablet Take 1 tablet (200 mg total) by mouth 2 (two) times daily. (Patient taking differently: Take 200 mg by mouth daily. ) 60 tablet 1  . Calcium Carb-Cholecalciferol (CALCIUM 1000 + D PO) Take 1 tablet by mouth daily.    . clonazePAM (KLONOPIN) 0.5 MG tablet Take 1 tablet (0.5 mg total) by mouth 3 (three) times daily as needed for anxiety. 90 tablet 0  . Cyanocobalamin (VITAMIN B 12 PO) Take by mouth.    . fish oil-omega-3 fatty acids 1000 MG capsule Take 2 g by mouth daily.    . Magnesium 500 MG CAPS Take 1 capsule by mouth daily.    Marland Kitchen buPROPion (WELLBUTRIN SR) 150 MG 12 hr tablet Take 1 tablet by mouth at  bedtime. Take 1 tab in the evening by mouth daily  2   No current facility-administered medications for this visit.    LABS/IMAGING: No results found for this or any previous visit (from the past 48 hour(s)). No results found.  WEIGHTS: Wt Readings from Last 3 Encounters:  05/16/15 156 lb 7 oz (70.96 kg)  04/04/15 158 lb (71.668 kg)  09/08/14 159 lb 6.4 oz (72.303 kg)    VITALS: BP 102/64 mmHg  Pulse 66  Ht 5\' 4"  (1.626 m)  Wt 156 lb 7 oz (70.96 kg)  BMI 26.84 kg/m2  LMP 05/04/2015  EXAM: General appearance: alert, cooperative and no distress Neck: no adenopathy, no carotid bruit, supple, symmetrical, trachea midline and thyroid not enlarged, symmetric, no tenderness/mass/nodules Lungs: clear to auscultation bilaterally Heart: regular rate and rhythm, S1, S2 normal, no murmur, click, rub  or gallop Abdomen: soft, non-tender; bowel sounds normal; no masses,  no organomegaly Extremities: extremities normal, atraumatic, no cyanosis or edema Pulses: 2+ and symmetric Skin: Skin color, texture, turgor normal. No rashes or lesions  EKG: 3/22: Normal sinus rhythm, no abnormalities  ASSESSMENT: 1. At Risk for CAD 2. Depression / Anxiety 3. Stress   PLAN: 1.   Calcium Score / CT 2.   Exercise Stress Test 3.   Lipid panel as needed. Last in 08/2014 4.   Follow up in 3 months after above workup.  5.   Will consider starting statin therapy pending Calcium Score.   [name] 05/16/2015, 5:29 PM  Pt. Seen and examined. Agree with the NP/PA-C note as written. Pleasant 50 year old female who is describing chest discomfort that's worse with positional changes, sometimes worse with exercise and relieved at rest but other times present at rest. She reports being under a lot of stress currently. There is been recent illness in her family and her mother had severe heart failure and recently died. She also had a tree fall into her house recently. Her pain sounds somewhat atypical to me. She has few personal risk factors but strong family risk. She did have a lipid profile in the past which was mildly elevated with an LDL in the 130s. I'm recommending an exercise treadmill stress test to evaluate for ischemia. In addition for risk stratification I recommend a coronary artery calcium score and repeat lipid profile. If the calcium score significantly elevated, then I would more aggressively treat her dyslipidemia.  Plan to see her back to discuss those results in a few weeks.  Pixie Casino, MD, [facility] Attending Cardiologist [place]

## 2015-05-17 DIAGNOSIS — R079 Chest pain, unspecified: Secondary | ICD-10-CM | POA: Insufficient documentation

## 2015-05-25 ENCOUNTER — Telehealth (HOSPITAL_COMMUNITY): Payer: Self-pay

## 2015-05-25 ENCOUNTER — Ambulatory Visit (INDEPENDENT_AMBULATORY_CARE_PROVIDER_SITE_OTHER)
Admission: RE | Admit: 2015-05-25 | Discharge: 2015-05-25 | Disposition: A | Discharge status: Home / Self Care | Payer: Self-pay | Source: Ambulatory Visit | Attending: Internal Medicine | Admitting: Internal Medicine

## 2015-05-25 DIAGNOSIS — R079 Chest pain, unspecified: Secondary | ICD-10-CM

## 2015-05-25 NOTE — Telephone Encounter (Signed)
Encounter complete. 

## 2015-05-30 ENCOUNTER — Ambulatory Visit (HOSPITAL_COMMUNITY)
Admission: RE | Admit: 2015-05-30 | Discharge: 2015-05-30 | Disposition: A | Discharge status: Home / Self Care | Payer: BC Managed Care – PPO | Source: Ambulatory Visit | Attending: Cardiovascular Disease | Admitting: Cardiovascular Disease

## 2015-05-30 DIAGNOSIS — R079 Chest pain, unspecified: Secondary | ICD-10-CM | POA: Diagnosis not present

## 2015-05-31 LAB — EXERCISE TOLERANCE TEST
CHL RATE OF PERCEIVED EXERTION: 17
CSEPEDS: 37 s
Estimated workload: 12.7 METS
Exercise duration (min): 10 min
MPHR: 173 {beats}/min
Peak HR: 173 {beats}/min
Percent HR: 100 %
Rest HR: 71 {beats}/min

## 2015-06-05 ENCOUNTER — Encounter: Payer: Self-pay | Admitting: Internal Medicine

## 2015-06-05 ENCOUNTER — Other Ambulatory Visit: Payer: Self-pay | Admitting: Family Medicine

## 2015-06-05 ENCOUNTER — Ambulatory Visit (INDEPENDENT_AMBULATORY_CARE_PROVIDER_SITE_OTHER): Payer: BC Managed Care – PPO | Admitting: Internal Medicine

## 2015-06-05 VITALS — BP 114/68 | HR 80 | Ht 64.0 in | Wt 158.8 lb

## 2015-06-05 DIAGNOSIS — R079 Chest pain, unspecified: Secondary | ICD-10-CM | POA: Diagnosis not present

## 2015-06-05 NOTE — Progress Notes (Signed)
Patient ID: Ana Griffin, female   DOB: 10-11-1965, 50 y.o.   MRN: [id number]    OFFICE NOTE  Chief Complaint:  Follow-up stress tests  Primary Care Physician: Leandrew Koyanagi, MD  HPI:  Ana Griffin is a 50 y.o. female with hx of Depression, Insomnia. She presents today as a self-referral due to recent development of chest discomfort / pain. She says that she has had discomfort on the left side of her chest that occurs at any time, not with exertion. The feeling lasts minutes to around 2 hours, and resolves with time. She has not had shortness of breath. No nausea, or diaphoresis during these episodes. She has anecdotally experienced five separate episodes of nausea. One month ago she developed one episode of left arm pain that resolved. Her chest was not tender to palpation. She denies lower extremity edema, or orthopnea. She says she has never had acid reflux, belching, or needed to take medicine for stomach acid. She became concerned that the constellation of these symptoms may be cardiac related.   No family history of early MI. Mom with CHF diagnosed at an early age - 77. She does not exercise very frequently. She tries to live as healthily as she can. Under significant stress with the death of multiple family members over the past year, and recently had a tree fall on her house and destroy most of it.   Ana Griffin returns today for follow-up of her exercise treadmill stress test and cardiac calcium score. Her calcium score was 0 with no extracardiac findings. Her stress test was low risk good exercise tolerance. There were a few extrasystoles with exercise. I reassured her today. Based on her intermediate long-term risk, one could consider treatment of her LDL cholesterol which is just over 1:30, however given the negative coronary calcium score, I would not strongly advocate for it. She's had recent weight gain and probably would benefit from weight loss and dietary changes. This  would likely also help her stress which I think is causing her symptoms.  PMHx:  Past Medical History  Diagnosis Date  . 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 Depression     Past Surgical History  Procedure Laterality Date  . Cervical biopsy  w/ loop electrode excision  01/2002  . Colposcopy      FAMHx:  Family History  Problem Relation Age of Onset  . Hypertension Mother   . Diabetes Mother   . Stroke Mother   . COPD Mother   . Heart failure Mother   . Breast cancer Maternal Aunt 52  . Cancer Maternal Aunt     BRAIN  . Cancer Father     Lung cancer    SOCHx:   reports that she quit smoking about 11 years ago. She has never used smokeless tobacco. She reports that she drinks alcohol. She reports that she does not use illicit drugs.  ALLERGIES:  No Known Allergies  ROS: Pertinent items are noted in HPI.  HOME MEDS: Current Outpatient Prescriptions  Medication Sig Dispense Refill  . Ascorbic Acid (VITAMIN C) 1000 MG tablet Take 1,000 mg by mouth daily.    Marland Kitchen aspirin 81 MG tablet Take 81 mg by mouth daily.    Marland Kitchen BIOTIN PO Take 1 tablet by mouth daily.    Marland Kitchen buPROPion (WELLBUTRIN SR) 150 MG 12 hr tablet Take 1 tablet by mouth at bedtime. Take 1 tab in the evening by mouth daily  2  .  buPROPion (WELLBUTRIN SR) 200 MG 12 hr tablet Take 1 tablet (200 mg total) by mouth 2 (two) times daily. (Patient taking differently: Take 200 mg by mouth daily. ) 60 tablet 1  . Calcium Carb-Cholecalciferol (CALCIUM 1000 + D PO) Take 1 tablet by mouth daily.    . clonazePAM (KLONOPIN) 0.5 MG tablet Take 1 tablet (0.5 mg total) by mouth 3 (three) times daily as needed for anxiety. 90 tablet 0  . Cyanocobalamin (VITAMIN B 12 PO) Take by mouth.    . fish oil-omega-3 fatty acids 1000 MG capsule Take 2 g by mouth daily.    . Magnesium 500 MG CAPS Take 1 capsule by mouth daily.     No current facility-administered medications for this visit.    LABS/IMAGING: No results  found for this or any previous visit (from the past 48 hour(s)). No results found.  WEIGHTS: Wt Readings from Last 3 Encounters:  06/05/15 158 lb 12.8 oz (72.031 kg)  05/16/15 156 lb 7 oz (70.96 kg)  04/04/15 158 lb (71.668 kg)    VITALS: BP 114/68 mmHg  Pulse 80  Ht 5\' 4"  (1.626 m)  Wt 158 lb 12.8 oz (72.031 kg)  BMI 27.24 kg/m2  LMP 05/04/2015  EXAM: Deferred  EKG: Deferred  ASSESSMENT: 1. At Risk for CAD - 0 coronary artery calcium score, low risk exercise stress test 2. Depression / Anxiety 3. Stress   PLAN: 1.   Very low risk findings on coronary calcium score and stress testing. At this time I would not advocate for statin therapy although she should work on exercise and dietary changes to lower cholesterol. I suspect her chest pain is noncardiac and likely related to stress/anxiety. There is a family history of coronary disease and she will need to continue to be vigilant about risk factor modification in the long-term because she is at increased risk of developing heart disease. Follow-up with me as needed.  Pixie Casino, MD, [facility] Attending Cardiologist [place] 06/05/2015, 8:14 AM

## 2015-06-05 NOTE — Patient Instructions (Signed)
Your physician recommends that you schedule a follow-up appointment as needed  

## 2015-06-06 NOTE — Telephone Encounter (Signed)
Meds ordered this encounter  Medications  . clonazePAM (KLONOPIN) 0.5 MG tablet    Sig: TAKE 1 TABLET BY MOUTH THREE TIMES DAILY AS NEEDED FOR ANXIETY    Dispense:  90 tablet    Refill:  0   Needs f/u before next refill ?new pcp---recommend Philis Fendt to her

## 2015-06-08 NOTE — Telephone Encounter (Signed)
LMOM for pt to advise of RF and recommend Legrand Como as new PCP.

## 2015-06-11 NOTE — Telephone Encounter (Signed)
Faxed

## 2015-07-18 ENCOUNTER — Other Ambulatory Visit: Payer: Self-pay | Admitting: Family Medicine

## 2015-07-20 NOTE — Telephone Encounter (Signed)
Patient needs a follow up for prescription refill, What are her plans?

## 2015-08-04 ENCOUNTER — Ambulatory Visit (INDEPENDENT_AMBULATORY_CARE_PROVIDER_SITE_OTHER): Payer: BC Managed Care – PPO | Admitting: Internal Medicine

## 2015-08-04 VITALS — BP 110/70 | HR 74 | Temp 98.0°F | Resp 18 | Ht 63.5 in | Wt 159.2 lb

## 2015-08-04 DIAGNOSIS — F32A Depression, unspecified: Secondary | ICD-10-CM

## 2015-08-04 DIAGNOSIS — F329 Major depressive disorder, single episode, unspecified: Secondary | ICD-10-CM | POA: Diagnosis not present

## 2015-08-04 MED ORDER — BUPROPION HCL ER (SR) 200 MG PO TB12: ORAL_TABLET | ORAL | Status: DC

## 2015-08-04 MED ORDER — CLONAZEPAM 0.5 MG PO TABS: 0.5000 mg | ORAL_TABLET | Freq: Three times a day (TID) | ORAL | Status: DC | PRN

## 2015-08-04 NOTE — Patient Instructions (Signed)
     IF you received an x-ray today, you will receive an invoice from [facility]. Please contact [facility] at [phone] with questions or concerns regarding your invoice.   IF you received labwork today, you will receive an invoice from [facility]. Please contact [facility] at [phone] with questions or concerns regarding your invoice.   Our billing staff will not be able to assist you with questions regarding bills from these companies.  You will be contacted with the lab results as soon as they are available. The fastest way to get your results is to activate your My Chart account. Instructions are located on the last page of this paperwork. If you have not heard from us regarding the results in 2 weeks, please contact this office.      

## 2015-08-06 NOTE — Progress Notes (Signed)
Chief Complaint  Patient presents with  . Medication Refill  . Depression    per triage screening   Patient Active Problem List   Diagnosis Date Noted  . Depression 10/21/2011  . Weight gain 10/21/2011  . Dysmenorrhea 10/21/2011   Better on wellbutr at incr dose--at least not crying all the time Still unable to settle estate and deal w/ family turmoil relieving her stress. Brother gets out of prison this week and will compliacte things. Has started with new couns who is very helpful  Depression  Meds ordered this encounter  Medications  . buPROPion (WELLBUTRIN SR) 200 MG 12 hr tablet    Sig: TAKE 1 TABLET(200 MG) BY MOUTH TWICE DAILY.    Dispense:  180 tablet    Refill:  3  . clonazePAM (KLONOPIN) 0.5 MG tablet    Sig: Take 1 tablet (0.5 mg total) by mouth 3 (three) times daily as needed. for anxiety    Dispense:  90 tablet    Refill:  5= only needs this HS but finances better to fill it this way   F/u here 3-1mo

## 2015-09-01 ENCOUNTER — Other Ambulatory Visit: Payer: Self-pay | Admitting: Internal Medicine

## 2016-03-26 ENCOUNTER — Other Ambulatory Visit: Payer: Self-pay

## 2016-03-26 MED ORDER — CLONAZEPAM 0.5 MG PO TABS: 0.5000 mg | ORAL_TABLET | Freq: Three times a day (TID) | ORAL | 0 refills | Status: DC | PRN

## 2016-03-26 NOTE — Telephone Encounter (Signed)
[facility] 

## 2016-03-26 NOTE — Telephone Encounter (Signed)
Meds ordered this encounter  Medications  . clonazePAM (KLONOPIN) 0.5 MG tablet    Sig: Take 1 tablet (0.5 mg total) by mouth 3 (three) times daily as needed. for anxiety    Dispense:  90 tablet    Refill:  0    Please notify patient that she needs an OV for additional refills, and to establish with a new PCP, as Dr. Laney Pastor has retired.

## 2016-03-27 NOTE — Telephone Encounter (Signed)
[facility] 

## 2016-12-25 ENCOUNTER — Ambulatory Visit (INDEPENDENT_AMBULATORY_CARE_PROVIDER_SITE_OTHER): Payer: BC Managed Care – PPO | Admitting: Physician Assistant

## 2016-12-25 ENCOUNTER — Encounter: Payer: Self-pay | Admitting: Physician Assistant

## 2016-12-25 VITALS — BP 120/68 | HR 70 | Temp 97.8°F | Resp 18 | Ht 64.06 in | Wt 168.8 lb

## 2016-12-25 DIAGNOSIS — F32A Depression, unspecified: Secondary | ICD-10-CM

## 2016-12-25 DIAGNOSIS — F329 Major depressive disorder, single episode, unspecified: Secondary | ICD-10-CM | POA: Diagnosis not present

## 2016-12-25 MED ORDER — ESCITALOPRAM OXALATE 10 MG PO TABS: 10.0000 mg | ORAL_TABLET | Freq: Every day | ORAL | 0 refills | Status: DC

## 2016-12-25 NOTE — Progress Notes (Deleted)
Ana Griffin  MRN: [id number] DOB: 03-03-1965  Subjective:  Pt is a 51 y.o. female who presents for annual physical exam and ***.  Last dental exam:  Last vision exam: Last pap smear: Last mammogram: Last colonoscopy: Vaccinations      Tetanus      HPV      Zostavax  Chronic conditions: 1)Depression: Has been struggling with this for years. Used to be followed by Dr. Ellin Goodie  Tapered completely off wellbutrin in 08/2015. Still using klonopin as needed for anxiety and to help sleep. Uses about 0.125 mg twice a week. Used to take klonopin 0.5mg  TID.    Patient Active Problem List   Diagnosis Date Noted  . Chest pain, unspecified 05/17/2015  . DUB (dysfunctional uterine bleeding) 10/28/2011  . 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 Depression 10/21/2011  . Weight gain 10/21/2011  . Dysmenorrhea 10/21/2011    Current Outpatient Prescriptions on File Prior to Visit  Medication Sig Dispense Refill  . Ascorbic Acid (VITAMIN C) 1000 MG tablet Take 1,000 mg by mouth daily.    Marland Kitchen aspirin 81 MG tablet Take 81 mg by mouth daily.    Marland Kitchen BIOTIN PO Take 1 tablet by mouth daily. Reported on 08/04/2015    . buPROPion (WELLBUTRIN SR) 200 MG 12 hr tablet TAKE 1 TABLET(200 MG) BY MOUTH TWICE DAILY. (Patient not taking: Reported on 12/25/2016) 180 tablet 3  . Calcium Carb-Cholecalciferol (CALCIUM 1000 + D PO) Take 1 tablet by mouth daily.    . clonazePAM (KLONOPIN) 0.5 MG tablet Take 1 tablet (0.5 mg total) by mouth 3 (three) times daily as needed. for anxiety 90 tablet 0  . Cyanocobalamin (VITAMIN B 12 PO) Take by mouth.    . fish oil-omega-3 fatty acids 1000 MG capsule Take 2 g by mouth daily.    . Magnesium 500 MG CAPS Take 1 capsule by mouth daily. Reported on 08/04/2015     No current facility-administered medications on file prior to visit.     No Known Allergies  Social History   Social History  . Marital status: Single    Spouse name: N/A  . Number of children: N/A  . Years  of education: N/A   Social History Main Topics  . Smoking status: Former Smoker    Types: Cigars    Quit date: 10/21/2003  . Smokeless tobacco: Never Used  . Alcohol use Yes     Comment: OCC  . Drug use: No  . Sexual activity: No   Other Topics Concern  . None   Social History Narrative  . None    Past Surgical History:  Procedure Laterality Date  . CERVICAL BIOPSY  W/ LOOP ELECTRODE EXCISION  01/2002  . COLPOSCOPY      Family History  Problem Relation Age of Onset  . Hypertension Mother   . Diabetes Mother   . Stroke Mother   . COPD Mother   . Heart failure Mother   . Mental illness Mother   . Cancer Father        Lung cancer  . Breast cancer Maternal Aunt 52  . Cancer Maternal Aunt        BRAIN    Review of Systems  Objective:  BP 120/68 (BP Location: Right Arm, Patient Position: Sitting, Cuff Size: Normal)   Pulse 70   Temp 97.8 F (36.6 C) (Oral)   Resp 18   Ht 5' 4.06" (1.627 m)   Wt 168 lb 12.8 oz (76.6 kg)  LMP 12/20/2016 (Exact Date)   SpO2 99%   BMI 28.92 kg/m   Physical Exam No exam data present  Assessment and Plan :  Discussed healthy lifestyle, diet, exercise, preventative care, vaccinations, and addressed patient's concerns. Plan for follow up in ***. Otherwise, plan for specific conditions below.  There are no diagnoses linked to this encounter.  Tenna Delaine PA-C  [facility] 12/25/2016 3:54 PM

## 2016-12-25 NOTE — Progress Notes (Deleted)
   Ana Griffin  MRN: [id number] DOB: May 07, 1965  Subjective:  @NAMEBYAGE @ is a 51 y.o. female who presents for annual physical exam and ***.  Last dental exam:  Last vision exam: Last pap smear: Last mammogram: Last colonoscopy: Vaccinations      Tetanus      HPV      Zostavax  Patient Active Problem List   Diagnosis Date Noted  . Chest pain, unspecified 05/17/2015  . DUB (dysfunctional uterine bleeding) 10/28/2011  . 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 Depression 10/21/2011  . Weight gain 10/21/2011  . Dysmenorrhea 10/21/2011    Current Outpatient Prescriptions on File Prior to Visit  Medication Sig Dispense Refill  . Ascorbic Acid (VITAMIN C) 1000 MG tablet Take 1,000 mg by mouth daily.    Marland Kitchen aspirin 81 MG tablet Take 81 mg by mouth daily.    Marland Kitchen BIOTIN PO Take 1 tablet by mouth daily. Reported on 08/04/2015    . buPROPion (WELLBUTRIN SR) 200 MG 12 hr tablet TAKE 1 TABLET(200 MG) BY MOUTH TWICE DAILY. (Patient not taking: Reported on 12/25/2016) 180 tablet 3  . Calcium Carb-Cholecalciferol (CALCIUM 1000 + D PO) Take 1 tablet by mouth daily.    . clonazePAM (KLONOPIN) 0.5 MG tablet Take 1 tablet (0.5 mg total) by mouth 3 (three) times daily as needed. for anxiety 90 tablet 0  . Cyanocobalamin (VITAMIN B 12 PO) Take by mouth.    . fish oil-omega-3 fatty acids 1000 MG capsule Take 2 g by mouth daily.    . Magnesium 500 MG CAPS Take 1 capsule by mouth daily. Reported on 08/04/2015     No current facility-administered medications on file prior to visit.     No Known Allergies  Social History   Social History  . Marital status: Single    Spouse name: N/A  . Number of children: N/A  . Years of education: N/A   Social History Main Topics  . Smoking status: Former Smoker    Types: Cigars    Quit date: 10/21/2003  . Smokeless tobacco: Never Used  . Alcohol use Yes     Comment: OCC  . Drug use: No  . Sexual activity: No   Other Topics Concern  . None   Social  History Narrative  . None    Past Surgical History:  Procedure Laterality Date  . CERVICAL BIOPSY  W/ LOOP ELECTRODE EXCISION  01/2002  . COLPOSCOPY      Family History  Problem Relation Age of Onset  . Hypertension Mother   . Diabetes Mother   . Stroke Mother   . COPD Mother   . Heart failure Mother   . Mental illness Mother   . Cancer Father        Lung cancer  . Breast cancer Maternal Aunt 52  . Cancer Maternal Aunt        BRAIN    Review of Systems  Objective:  There were no vitals taken for this visit.  Physical Exam No exam data present  Assessment and Plan :  Discussed healthy lifestyle, diet, exercise, preventative care, vaccinations, and addressed patient's concerns. Plan for follow up in ***. Otherwise, plan for specific conditions below.  There are no diagnoses linked to this encounter.  Tenna Delaine PA-C  [facility] 12/25/2016 3:50 PM

## 2016-12-25 NOTE — Patient Instructions (Addendum)
For depression  I recommend we start a daily SSRI and weekly/monthly therapy.  The SSRI I am prescribing is called lexapro. Please keep in mind that when you start an SSRI it can worsen your depression and anxiety symptoms. It can also increase risk of suicidal thoughts so if this occurs, please seek care immediately. Common side effects of SSRIs include, but are not limited to, GI upset, nausea, vomiting, insomnia, and drowsiness. Typically these side effects will resolve after 2 weeks. Please keep in mind that it can take up to 4-6 weeks for SSRIs to be fully effective. Below is some information for local therapists. I recommend you reach out to one of these therapists before our next visit.Plan to return to clinic in 4 weeks for reevaluation of symptoms and for complete physical exam.   For therapy -- [facility] ([address] [facility] Levins) - [phone] [facility] [place]) - [address] [place] - [phone] [facility]  - [phone] (do not file insurance)   Your goals before your next visit: 1) Contact your therapist and schedule an appointment 2) Seek out local yoga class and see if you like it 3) Do something you enjoy for self care, such as art work  It was great meeting you today.  I look forward to seeing you again in 4 weeks.  IF you received an x-ray today, you will receive an invoice from [facility]. Please contact [facility] at [phone] with questions or concerns regarding your invoice.   IF you received labwork today, you will receive an invoice from [facility]. Please contact [facility] at [phone] with questions or concerns regarding your invoice.   Our billing staff will not be able to assist you with questions regarding bills from these  companies.  You will be contacted with the lab results as soon as they are available. The fastest way to get your results is to activate your My Chart account. Instructions are located on the last page of this paperwork. If you have not heard from [place] regarding the results in 2 weeks, please contact this office.

## 2016-12-25 NOTE — Progress Notes (Signed)
Ana Griffin  MRN: [id number] DOB: 05/19/1965  Subjective:  Ana Griffin is a 51 y.o. female seen in office today for a chief complaint of follow up on depression and to establish care.  Patient has had diagnosis of depression for years.  She used to be followed in our clinic by Dr. Laney Pastor.  Was started on Wellbutrin in 2005 for depression and to help with smoking.  Notes that she did quit smoking however she does not feel that it ever controlled her depression really well.  In July 2017 she decided to tapers herself off Wellbutrin.  She has not followed up since for further evaluation.  Notes she has been feeling very overwhelmed lately.  She is the main caregiver for her 2 daughters, who were adopted.  She finds her self caring for her neighbors and friends frequently and never takes time for self-care.  She has had frequent bouts of crying, decreased interest in doing things,  sleeping too many hours, little energy, and eating too much.  She denies suicidal or homicidal thoughts.  She used to see a therapist.  Has not seen one in 9 months but does have one that she likes going to.  She is interested in both medication and restarting therapy at this time.  Of note, patient does not feel like she suffers from anxiety.  Dr. Laney Pastor did prescribe her Klonopin 0.5 mg 3 times a day for anxiety but she really just uses it to help her sleep.  She has tapered herself down to where she only takes 1/4 tablet of Klonopin 0.5 mg twice a week.  Has no other questions or complaints.  Review of Systems  Constitutional: Negative for chills, diaphoresis and fever.  Respiratory: Negative for shortness of breath.   Cardiovascular: Negative for chest pain and palpitations.  Gastrointestinal: Negative for abdominal pain, diarrhea and nausea.  Neurological: Negative for dizziness.    Patient Active Problem List   Diagnosis Date Noted  . Chest pain, unspecified 05/17/2015  . DUB (dysfunctional uterine  bleeding) 10/28/2011  . 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 Depression 10/21/2011  . Weight gain 10/21/2011  . Dysmenorrhea 10/21/2011    Current Outpatient Prescriptions on File Prior to Visit  Medication Sig Dispense Refill  . Ascorbic Acid (VITAMIN C) 1000 MG tablet Take 1,000 mg by mouth daily.    Marland Kitchen aspirin 81 MG tablet Take 81 mg by mouth daily.    Marland Kitchen BIOTIN PO Take 1 tablet by mouth daily. Reported on 08/04/2015    . Calcium Carb-Cholecalciferol (CALCIUM 1000 + D PO) Take 1 tablet by mouth daily.    . clonazePAM (KLONOPIN) 0.5 MG tablet Take 1 tablet (0.5 mg total) by mouth 3 (three) times daily as needed. for anxiety 90 tablet 0  . Cyanocobalamin (VITAMIN B 12 PO) Take by mouth.    . fish oil-omega-3 fatty acids 1000 MG capsule Take 2 g by mouth daily.    . Magnesium 500 MG CAPS Take 1 capsule by mouth daily. Reported on 08/04/2015     No current facility-administered medications on file prior to visit.     No Known Allergies   Objective:  BP 120/68 (BP Location: Right Arm, Patient Position: Sitting, Cuff Size: Normal)   Pulse 70   Temp 97.8 F (36.6 C) (Oral)   Resp 18   Ht 5' 4.06" (1.627 m)   Wt 168 lb 12.8 oz (76.6 kg)   LMP 12/20/2016 (Exact Date)   SpO2 99%   BMI  28.92 kg/m   Physical Exam  Constitutional: She is oriented to person, place, and time.  Well-developed, well-nourished female.  HENT:  Head: Normocephalic and atraumatic.  Eyes: Conjunctivae are normal.  Neck: Normal range of motion.  Cardiovascular: Normal rate, regular rhythm and normal heart sounds.   Pulmonary/Chest: Effort normal and breath sounds normal.  Neurological: She is alert and oriented to person, place, and time. Gait normal.  Skin: Skin is warm and dry.  Psychiatric: Affect normal.  Very tearful during exam.    Vitals reviewed.    Depression screen [facility] 2/9 12/25/2016 08/04/2015 04/04/2015 09/08/2014  Decreased Interest 2 1 3  0  Down, Depressed, Hopeless 2 1 3  0  PHQ - 2 Score 4  2 6  0  Altered sleeping 3 0 1 -  Tired, decreased energy 2 2 3  -  Change in appetite 3 2 2  -  Feeling bad or failure about yourself  1 2 3  -  Trouble concentrating 0 2 2 -  Moving slowly or fidgety/restless 0 0 0 -  Suicidal thoughts 0 1 1 -  PHQ-9 Score 13 11 18  -  Difficult doing work/chores Somewhat difficult Somewhat difficult Very difficult -     Assessment and Plan :  1. Depression, unspecified depression type I have agreed to take over patient's care.  Uncontrolled at this time.  She has not suicidal or homicidal.  Had a long discussion with patient regarding her depression.  I believe she would benefit from both medication and therapy at this time.  Given Rx for Lexapro. Educated on potential side effects.  Considering she does not have anxiety, educated that she can fully taper herself off Klonopin at this time.  She is established with a therapist and will contact them to set up an appointment.  She also made 3 goals today in the office to try and achieve before her next visit with me.  Recommended follow-up in 4 weeks.  Will complete complete physical exam at this time and evaluate how depression is doing. - escitalopram (LEXAPRO) 10 MG tablet; Take 1 tablet (10 mg total) by mouth daily.  Dispense: 90 tablet; Refill: 0  A total of 25 minutes was spent in the room with the patient, greater than 50% of which was in counseling/coordination of care regarding depression.  Tenna Delaine PA-C  [facility] 12/25/2016 4:31 PM

## 2017-01-28 ENCOUNTER — Encounter: Payer: BC Managed Care – PPO | Admitting: Physician Assistant

## 2017-02-14 ENCOUNTER — Other Ambulatory Visit: Payer: Self-pay | Admitting: Physician Assistant

## 2017-02-18 ENCOUNTER — Ambulatory Visit (INDEPENDENT_AMBULATORY_CARE_PROVIDER_SITE_OTHER): Payer: BC Managed Care – PPO | Admitting: Physician Assistant

## 2017-02-18 ENCOUNTER — Encounter: Payer: Self-pay | Admitting: Physician Assistant

## 2017-02-18 VITALS — BP 116/70 | HR 74 | Temp 97.6°F | Resp 18 | Ht 65.0 in | Wt 172.0 lb

## 2017-02-18 DIAGNOSIS — Z1329 Encounter for screening for other suspected endocrine disorder: Secondary | ICD-10-CM | POA: Diagnosis not present

## 2017-02-18 DIAGNOSIS — Z13228 Encounter for screening for other metabolic disorders: Secondary | ICD-10-CM

## 2017-02-18 DIAGNOSIS — F329 Major depressive disorder, single episode, unspecified: Secondary | ICD-10-CM

## 2017-02-18 DIAGNOSIS — Z1231 Encounter for screening mammogram for malignant neoplasm of breast: Secondary | ICD-10-CM | POA: Diagnosis not present

## 2017-02-18 DIAGNOSIS — Z124 Encounter for screening for malignant neoplasm of cervix: Secondary | ICD-10-CM | POA: Diagnosis not present

## 2017-02-18 DIAGNOSIS — Z130 Encounter for screening for diseases of the blood and blood-forming organs and certain disorders involving the immune mechanism: Secondary | ICD-10-CM | POA: Diagnosis not present

## 2017-02-18 DIAGNOSIS — Z1239 Encounter for other screening for malignant neoplasm of breast: Secondary | ICD-10-CM

## 2017-02-18 DIAGNOSIS — Z13220 Encounter for screening for lipoid disorders: Secondary | ICD-10-CM | POA: Diagnosis not present

## 2017-02-18 DIAGNOSIS — Z0000 Encounter for general adult medical examination without abnormal findings: Secondary | ICD-10-CM | POA: Diagnosis not present

## 2017-02-18 DIAGNOSIS — Z1389 Encounter for screening for other disorder: Secondary | ICD-10-CM | POA: Diagnosis not present

## 2017-02-18 DIAGNOSIS — E538 Deficiency of other specified B group vitamins: Secondary | ICD-10-CM

## 2017-02-18 DIAGNOSIS — F419 Anxiety disorder, unspecified: Secondary | ICD-10-CM

## 2017-02-18 LAB — POCT URINALYSIS DIP (MANUAL ENTRY)
BILIRUBIN UA: NEGATIVE mg/dL
Bilirubin, UA: NEGATIVE
Glucose, UA: NEGATIVE mg/dL
Leukocytes, UA: NEGATIVE
Nitrite, UA: NEGATIVE
PH UA: 6.5 (ref 5.0–8.0)
PROTEIN UA: NEGATIVE mg/dL
SPEC GRAV UA: 1.025 (ref 1.010–1.025)
Urobilinogen, UA: 0.2 E.U./dL

## 2017-02-18 MED ORDER — ESCITALOPRAM OXALATE 10 MG PO TABS: 20.0000 mg | ORAL_TABLET | Freq: Every day | ORAL | 0 refills | Status: DC

## 2017-02-18 MED ORDER — HYDROXYZINE HCL 25 MG PO TABS: 12.5000 mg | ORAL_TABLET | Freq: Every day | ORAL | 1 refills | Status: DC | PRN

## 2017-02-18 NOTE — Progress Notes (Signed)
Ana Griffin  MRN: [id number] DOB: 1965/06/28  Subjective:  Pt is a 51 y.o. female who presents for annual physical exam and medication refill for anxiety and depression. She is fasting today.   Menstrual cycles: Regular, occurring monthly.  Denies dysmenorrhea or menorrhagia. Last annual exam: 2016 Last dental exam: 10/2016 Last vision exam: 01/2017, needs new Rx but has not purchased them yet Last pap smear: 10/21/2011 Last mammogram:2016, normal. Last colonoscopy: never Vaccinations      Tetanus: 2013      Influenza: Never      1) Anxiety and Depression: Pt initially seen by me on 12/25/16 for depression and anxiety. She had been on klonopin for quite some time for anxiety but felt as if it was not working. She had tapered herself off of it at that time. Was started on lexparo '10mg'$  daily. Notes initially it was very effective. However, for the past 2 weeks she has been feeling more depressed and anxious. Notes work and family is very very stressful. This is likely to continue unitl at least 08/2017.  She will occasionally have panic attacks, but they are typically resolved with deep breathing exercises.  Patient is seeing a therapist.  She has only seen them once since our initial visit on 12/25/2016.  She think she would benefit from meeting with him at least twice a month.  She is trying to get out and do more fun things with her friends.  Lives at home with her two adopted daughters and grandchildren.   Patient Active Problem List   Diagnosis Date Noted  . DUB (dysfunctional uterine bleeding) 10/28/2011  . 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 Depression 10/21/2011  . Weight gain 10/21/2011  . Dysmenorrhea 10/21/2011    Current Outpatient Medications on File Prior to Visit  Medication Sig Dispense Refill  . Ascorbic Acid (VITAMIN C) 1000 MG tablet Take 1,000 mg by mouth daily.    Marland Kitchen aspirin 81 MG tablet Take 81 mg by mouth daily.    Marland Kitchen BIOTIN PO Take 1 tablet by mouth daily.  Reported on 08/04/2015    . Calcium Carb-Cholecalciferol (CALCIUM 1000 + D PO) Take 1 tablet by mouth daily.    . Cyanocobalamin (VITAMIN B 12 PO) Take by mouth.    . fish oil-omega-3 fatty acids 1000 MG capsule Take 2 g by mouth daily.    . Magnesium 500 MG CAPS Take 1 capsule by mouth daily. Reported on 08/04/2015    . clonazePAM (KLONOPIN) 0.5 MG tablet Take 1 tablet (0.5 mg total) by mouth 3 (three) times daily as needed. for anxiety (Patient not taking: Reported on 02/18/2017) 90 tablet 0   No current facility-administered medications on file prior to visit.     No Known Allergies  Social History   Socioeconomic History  . Marital status: Single    Spouse name: None  . Number of children: 2  . Years of education: None  . Highest education level: Bachelor's degree (e.g., BA, AB, BS)  Social Needs  . Financial resource strain: None  . Food insecurity - worry: None  . Food insecurity - inability: None  . Transportation needs - medical: None  . Transportation needs - non-medical: None  Occupational History  . 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 Smoking status: Former Smoker    Types: Cigars    Last attempt to quit: 10/21/2003    Years since quitting: 13.3  . Smokeless tobacco: Never Used  Substance and Sexual Activity  .  Alcohol use: Yes    Comment: OCC  . Drug use: Yes    Types: Marijuana    Comment: occassionally, 3x a year  . Sexual activity: No    Partners: Female, Female    Birth control/protection: None    Comment: It has been over 10 years since she has been sexually active  Other Topics Concern  . None  Social History Narrative   Moved here from [place] in 1992. Lives at home with 2 adopted children and grandchildren.       Diet: Eats "unhealthy things." Likes frozen pizza, breads, [place], processed food. Caffeine intake has increased, doing a redbull a day. Takes multivitamins daily.       Not currently engaging in structured exercise.     Past  Surgical History:  Procedure Laterality Date  . CERVICAL BIOPSY  W/ LOOP ELECTRODE EXCISION  01/2002  . COLPOSCOPY      Family History  Problem Relation Age of Onset  . Hypertension Mother   . Diabetes Mother   . Stroke Mother   . COPD Mother   . Heart failure Mother   . Mental illness Mother   . Cancer Father        Lung cancer  . Breast cancer Maternal Aunt 52  . Cancer Maternal Aunt        BRAIN    Review of Systems  Constitutional: Positive for fatigue. Negative for activity change, appetite change, chills, diaphoresis, fever and unexpected weight change.  HENT: Negative for congestion, dental problem, drooling, ear discharge, ear pain, facial swelling, hearing loss, mouth sores, nosebleeds, postnasal drip, rhinorrhea, sinus pressure, sinus pain, sneezing, sore throat, tinnitus, trouble swallowing and voice change.   Eyes: Negative for photophobia, pain, discharge, redness, itching and visual disturbance.  Respiratory: Negative for apnea, cough, choking, chest tightness, shortness of breath, wheezing and stridor.   Cardiovascular: Negative for chest pain, palpitations and leg swelling.  Gastrointestinal: Negative for abdominal distention, abdominal pain, anal bleeding, blood in stool, constipation, diarrhea, nausea, rectal pain and vomiting.  Endocrine: Negative for cold intolerance, heat intolerance, polydipsia, polyphagia and polyuria.  Genitourinary: Positive for frequency. Negative for decreased urine volume, difficulty urinating, dyspareunia, dysuria, enuresis, flank pain, genital sores, hematuria, menstrual problem, pelvic pain, urgency, vaginal bleeding, vaginal discharge and vaginal pain.  Musculoskeletal: Negative for arthralgias, back pain, gait problem, joint swelling, myalgias, neck pain and neck stiffness.  Skin: Negative for color change, pallor, rash and wound.  Allergic/Immunologic: Negative for environmental allergies, food allergies and immunocompromised state.    Neurological: Positive for headaches. Negative for dizziness, tremors, seizures, syncope, facial asymmetry, speech difficulty, weakness, light-headedness and numbness.  Hematological: Negative for adenopathy. Does not bruise/bleed easily.  Psychiatric/Behavioral: Negative for agitation, behavioral problems, confusion, decreased concentration, dysphoric mood, hallucinations, self-injury, sleep disturbance and suicidal ideas. The patient is not nervous/anxious and is not hyperactive.     Objective:  BP 116/70   Pulse 74   Temp 97.6 F (36.4 C) (Oral)   Resp 18   Ht '5\' 5"'$  (1.651 m)   Wt 172 lb (78 kg)   SpO2 98%   BMI 28.62 kg/m   Physical Exam  Constitutional: She is oriented to person, place, and time and well-developed, well-nourished, and in no distress.  HENT:  Head: Normocephalic and atraumatic.  Right Ear: Hearing, tympanic membrane, external ear and ear canal normal.  Left Ear: Hearing, tympanic membrane, external ear and ear canal normal.  Nose: Nose normal.  Mouth/Throat: Uvula is midline, oropharynx is  clear and moist and mucous membranes are normal. No oropharyngeal exudate.  Eyes: Conjunctivae, EOM and lids are normal. Pupils are equal, round, and reactive to light. No scleral icterus.  Neck: Trachea normal and normal range of motion. No thyroid mass and no thyromegaly present.  Cardiovascular: Normal rate, regular rhythm, normal heart sounds and intact distal pulses.  Pulmonary/Chest: Effort normal and breath sounds normal. Right breast exhibits no inverted nipple, no mass, no nipple discharge, no skin change and no tenderness. Left breast exhibits no inverted nipple, no mass, no nipple discharge, no skin change and no tenderness. Breasts are symmetrical.  Abdominal: Soft. Normal appearance and bowel sounds are normal. There is no tenderness.  Genitourinary: Vagina normal, uterus normal, right adnexa normal, left adnexa normal and vulva normal.  Lymphadenopathy:       Head  (right side): No tonsillar, no preauricular, no posterior auricular and no occipital adenopathy present.       Head (left side): No tonsillar, no preauricular, no posterior auricular and no occipital adenopathy present.    She has no cervical adenopathy.       Right: No supraclavicular adenopathy present.       Left: No supraclavicular adenopathy present.  Neurological: She is alert and oriented to person, place, and time. She has normal sensation, normal strength and normal reflexes. Gait normal.  Skin: Skin is warm and dry.  Psychiatric: Affect normal.    Visual Acuity Screening   Right eye Left eye Both eyes  Without correction:     With correction: 20/13 20/10 -1 20/13   No results found for this or any previous visit (from the past 24 hour(s)).   Depression screen [facility] 2/9 02/18/2017 12/25/2016 08/04/2015  Decreased Interest '1 2 1  '$ Down, Depressed, Hopeless '1 2 1  '$ PHQ - 2 Score '2 4 2  '$ Altered sleeping 3 3 0  Tired, decreased energy '3 2 2  '$ Change in appetite '2 3 2  '$ Feeling bad or failure about yourself  '3 1 2  '$ Trouble concentrating 3 0 2  Moving slowly or fidgety/restless 1 0 0  Suicidal thoughts 1 0 1  PHQ-9 Score '18 13 11  '$ Difficult doing work/chores Very difficult Somewhat difficult Somewhat difficult    GAD 7 : Generalized Anxiety Score 02/18/2017 12/25/2016 12/25/2016  Nervous, Anxious, on Edge 2 0 0  Control/stop worrying 3 0 1  Worry too much - different things 2 0 0  Trouble relaxing '3 1 1  '$ Restless 0 0 0  Easily annoyed or irritable '1 1 1  '$ Afraid - awful might happen 1 0 0  Total GAD 7 Score '12 2 3  '$ Anxiety Difficulty Very difficult - Somewhat difficult     Assessment and Plan :  Discussed healthy lifestyle, diet, exercise, preventative care, vaccinations, and addressed patient's concerns. Plan for follow up in 3 months.  Otherwise, plan for specific conditions below. 1. Annual physical exam Await lab results.  2. Screening for metabolic disorder -  EXN17+GYFV  3. Screening, lipid - Lipid panel  4. Screening for thyroid disorder - TSH  5. Screening for cervical cancer - Pap IG and HPV (high risk) DNA detection  6. Screening for hematuria or proteinuria - POCT urinalysis dipstick - Urinalysis, microscopic only  7. Vitamin B12 deficiency - Vitamin B12  8. Screening, anemia, deficiency, iron - CBC with Differential/Platelet  9. Breast cancer screening - MM Digital Screening; Future  10. Anxiety and depression Uncontrolled at this time.  Per history and screening  tools, it appears that patient is suffering from both anxiety and depression.  Will increase Lexapro dose at this time and also add hydroxyzine for panic attacks.  Educated on potential side effects.  Encourage patient to increase therapy sessions to twice monthly.  Discussed with patient that if hydroxyzine does not adequately control her panic attacks, we can consider adding Klonopin back to her medication regimen as needed for true panic attacks.  Patient understands and agrees to treatment plan.  Plan to follow-up in 3 months for reevaluation or sooner if symptoms worsen. - escitalopram (LEXAPRO) 10 MG tablet; Take 2 tablets (20 mg total) by mouth daily.  Dispense: 180 tablet; Refill: 0  Tenna Delaine, PA-C  [facility] 02/22/2017 6:05 PM

## 2017-02-18 NOTE — Patient Instructions (Addendum)
It was a pleasure seeing you again today.  We have collected lab work and should have those results back with in 4-5 days.  For overall health maintenance, please see information below.  For depression and anxiety, I recommend we increase lexapro to '20mg'$  daily.  I also recommend a short term medication for moments of panic attacks.  In terms of relief of panic attacks, please use hydroxyzine as needed.  This medication can make you drowsy so please keep this in mind. I also recommend you increase therapy sessions to twice monthly. Plan for follow up in 3 months.    1) By 08/2017 have a new work environment By next visit.... 2) Increase therapy to twice monthly 3) Decrease junk food consumption by half 4) Attend one yoga class!     IF you received an x-ray today, you will receive an invoice from [facility]. Please contact [facility] at [phone] with questions or concerns regarding your invoice.   IF you received labwork today, you will receive an invoice from [facility]. Please contact [facility] at [phone] with questions or concerns regarding your invoice.   Our billing staff will not be able to assist you with questions regarding bills from these companies.  You will be contacted with the lab results as soon as they are available. The fastest way to get your results is to activate your My Chart account. Instructions are located on the last page of this paperwork. If you have not heard from [place] regarding the results in 2 weeks, please contact this office.    We recommend that you schedule a mammogram for breast cancer screening. Typically, you do not need a referral to do this. Please contact a local imaging center to schedule your mammogram.  [facility] - [phone]  *ask for the Radiology [place] ([place]) - [phone] or [phone]  [facility] - [phone] [place] [phone] [facility] - [phone]  *ask for the [facility] - [phone]  *ask for the Radiology Department [facility] - [phone]  *ask for the [place] - [phone]  Health Maintenance, Female Adopting a healthy lifestyle and getting preventive care can go a long way to promote health and wellness. Talk with your health care provider about what schedule of regular examinations is right for you. This is a good chance for you to check in with your provider about disease prevention and staying healthy. In between checkups, there are plenty of things you can do on your own. Experts have done a lot of research about which lifestyle changes and preventive measures are most likely to keep you healthy. Ask your health care provider for more information. Weight and diet Eat a healthy diet  Be sure to include plenty of vegetables, fruits, low-fat dairy products, and lean protein.  Do not eat a lot of foods high in solid fats, added sugars, or salt.  Get regular exercise. This is one of the most important things you can do for your health. ? Most adults should exercise for at least 150 minutes each week. The exercise should increase your heart rate and make you sweat (moderate-intensity exercise). ? Most adults should also do strengthening exercises at least twice a week. This is in addition to the moderate-intensity exercise.  Maintain a healthy weight  Body mass index (BMI) is a measurement that can be used to identify possible  weight problems. It estimates body fat based on height and weight. Your health care provider can help determine your BMI and help you achieve or maintain a healthy weight.  For females 71 years of age and older: ? A BMI below 18.5 is considered underweight. ? A BMI of 18.5 to 24.9 is normal. ? A BMI of 25 to 29.9 is considered overweight. ? A BMI of 30 and above  is considered obese.  Watch levels of cholesterol and blood lipids  You should start having your blood tested for lipids and cholesterol at 49 years of age, then have this test every 5 years.  You may need to have your cholesterol levels checked more often if: ? Your lipid or cholesterol levels are high. ? You are older than 49 years of age. ? You are at high risk for heart disease.  Cancer screening Lung Cancer  Lung cancer screening is recommended for adults 67-59 years old who are at high risk for lung cancer because of a history of smoking.  A yearly low-dose CT scan of the lungs is recommended for people who: ? Currently smoke. ? Have quit within the past 15 years. ? Have at least a 30-pack-year history of smoking. A pack year is smoking an average of one pack of cigarettes a day for 1 year.  Yearly screening should continue until it has been 15 years since you quit.  Yearly screening should stop if you develop a health problem that would prevent you from having lung cancer treatment.  Breast Cancer  Practice breast self-awareness. This means understanding how your breasts normally appear and feel.  It also means doing regular breast self-exams. Let your health care provider know about any changes, no matter how small.  If you are in your 20s or 30s, you should have a clinical breast exam (CBE) by a health care provider every 1-3 years as part of a regular health exam.  If you are 75 or older, have a CBE every year. Also consider having a breast X-ray (mammogram) every year.  If you have a family history of breast cancer, talk to your health care provider about genetic screening.  If you are at high risk for breast cancer, talk to your health care provider about having an MRI and a mammogram every year.  Breast cancer gene (BRCA) assessment is recommended for women who have family members with BRCA-related cancers. BRCA-related cancers  include: ? Breast. ? Ovarian. ? Tubal. ? Peritoneal cancers.  Results of the assessment will determine the need for genetic counseling and BRCA1 and BRCA2 testing.  Cervical Cancer Your health care provider may recommend that you be screened regularly for cancer of the pelvic organs (ovaries, uterus, and vagina). This screening involves a pelvic examination, including checking for microscopic changes to the surface of your cervix (Pap test). You may be encouraged to have this screening done every 3 years, beginning at age 47.  For women ages 19-65, health care providers may recommend pelvic exams and Pap testing every 3 years, or they may recommend the Pap and pelvic exam, combined with testing for human papilloma virus (HPV), every 5 years. Some types of HPV increase your risk of cervical cancer. Testing for HPV may also be done on women of any age with unclear Pap test results.  Other health care providers may not recommend any screening for nonpregnant women who are considered low risk for pelvic cancer and who do not have symptoms. Ask your health care provider  if a screening pelvic exam is right for you.  If you have had past treatment for cervical cancer or a condition that could lead to cancer, you need Pap tests and screening for cancer for at least 20 years after your treatment. If Pap tests have been discontinued, your risk factors (such as having a new sexual partner) need to be reassessed to determine if screening should resume. Some women have medical problems that increase the chance of getting cervical cancer. In these cases, your health care provider may recommend more frequent screening and Pap tests.  Colorectal Cancer  This type of cancer can be detected and often prevented.  Routine colorectal cancer screening usually begins at 49 years of age and continues through 49 years of age.  Your health care provider may recommend screening at an earlier age if you have risk factors  for colon cancer.  Your health care provider may also recommend using home test kits to check for hidden blood in the stool.  A small camera at the end of a tube can be used to examine your colon directly (sigmoidoscopy or colonoscopy). This is done to check for the earliest forms of colorectal cancer.  Routine screening usually begins at age 50.  Direct examination of the colon should be repeated every 5-10 years through 49 years of age. However, you may need to be screened more often if early forms of precancerous polyps or small growths are found.  Skin Cancer  Check your skin from head to toe regularly.  Tell your health care provider about any new moles or changes in moles, especially if there is a change in a mole's shape or color.  Also tell your health care provider if you have a mole that is larger than the size of a pencil eraser.  Always use sunscreen. Apply sunscreen liberally and repeatedly throughout the day.  Protect yourself by wearing long sleeves, pants, a wide-brimmed hat, and sunglasses whenever you are outside.  Heart disease, diabetes, and high blood pressure  High blood pressure causes heart disease and increases the risk of stroke. High blood pressure is more likely to develop in: ? People who have blood pressure in the high end of the normal range (130-139/85-89 mm Hg). ? People who are overweight or obese. ? People who are African American.  If you are 50-79 years of age, have your blood pressure checked every 3-5 years. If you are 55 years of age or older, have your blood pressure checked every year. You should have your blood pressure measured twice-once when you are at a hospital or clinic, and once when you are not at a hospital or clinic. Record the average of the two measurements. To check your blood pressure when you are not at a hospital or clinic, you can use: ? An automated blood pressure machine at a pharmacy. ? A home blood pressure monitor.  If  you are between 64 years and 53 years old, ask your health care provider if you should take aspirin to prevent strokes.  Have regular diabetes screenings. This involves taking a blood sample to check your fasting blood sugar level. ? If you are at a normal weight and have a low risk for diabetes, have this test once every three years after 49 years of age. ? If you are overweight and have a high risk for diabetes, consider being tested at a younger age or more often. Preventing infection Hepatitis B  If you have a higher risk for  hepatitis B, you should be screened for this virus. You are considered at high risk for hepatitis B if: ? You were born in a country where hepatitis B is common. Ask your health care provider which countries are considered high risk. ? Your parents were born in a high-risk country, and you have not been immunized against hepatitis B (hepatitis B vaccine). ? You have HIV or AIDS. ? You use needles to inject street drugs. ? You live with someone who has hepatitis B. ? You have had sex with someone who has hepatitis B. ? You get hemodialysis treatment. ? You take certain medicines for conditions, including cancer, organ transplantation, and autoimmune conditions.  Hepatitis C  Blood testing is recommended for: ? Everyone born from [birth year] through 1963. ? Anyone with known risk factors for hepatitis C.  Sexually transmitted infections (STIs)  You should be screened for sexually transmitted infections (STIs) including gonorrhea and chlamydia if: ? You are sexually active and are younger than 49 years of age. ? You are older than 49 years of age and your health care provider tells you that you are at risk for this type of infection. ? Your sexual activity has changed since you were last screened and you are at an increased risk for chlamydia or gonorrhea. Ask your health care provider if you are at risk.  If you do not have HIV, but are at risk, it may be recommended  that you take a prescription medicine daily to prevent HIV infection. This is called pre-exposure prophylaxis (PrEP). You are considered at risk if: ? You are sexually active and do not regularly use condoms or know the HIV status of your partner(s). ? You take drugs by injection. ? You are sexually active with a partner who has HIV.  Talk with your health care provider about whether you are at high risk of being infected with HIV. If you choose to begin PrEP, you should first be tested for HIV. You should then be tested every 3 months for as long as you are taking PrEP. Pregnancy  If you are premenopausal and you may become pregnant, ask your health care provider about preconception counseling.  If you may become pregnant, take 400 to 800 micrograms (mcg) of folic acid every day.  If you want to prevent pregnancy, talk to your health care provider about birth control (contraception). Osteoporosis and menopause  Osteoporosis is a disease in which the bones lose minerals and strength with aging. This can result in serious bone fractures. Your risk for osteoporosis can be identified using a bone density scan.  If you are 51 years of age or older, or if you are at risk for osteoporosis and fractures, ask your health care provider if you should be screened.  Ask your health care provider whether you should take a calcium or vitamin D supplement to lower your risk for osteoporosis.  Menopause may have certain physical symptoms and risks.  Hormone replacement therapy may reduce some of these symptoms and risks. Talk to your health care provider about whether hormone replacement therapy is right for you. Follow these instructions at home:  Schedule regular health, dental, and eye exams.  Stay current with your immunizations.  Do not use any tobacco products including cigarettes, chewing tobacco, or electronic cigarettes.  If you are pregnant, do not drink alcohol.  If you are  breastfeeding, limit how much and how often you drink alcohol.  Limit alcohol intake to no more than 1 drink  per day for nonpregnant women. One drink equals 12 ounces of beer, 5 ounces of wine, or 1 ounces of hard liquor.  Do not use street drugs.  Do not share needles.  Ask your health care provider for help if you need support or information about quitting drugs.  Tell your health care provider if you often feel depressed.  Tell your health care provider if you have ever been abused or do not feel safe at home. This information is not intended to replace advice given to you by your health care provider. Make sure you discuss any questions you have with your health care provider. Document Released: 08/26/2010 Document Revised: 07/19/2015 Document Reviewed: 11/14/2014 Elsevier Interactive Patient Education  [employer].

## 2017-02-19 ENCOUNTER — Telehealth: Payer: Self-pay | Admitting: Physician Assistant

## 2017-02-19 ENCOUNTER — Encounter: Payer: Self-pay | Admitting: Physician Assistant

## 2017-02-19 LAB — URINALYSIS, MICROSCOPIC ONLY: Casts: NONE SEEN /lpf

## 2017-02-19 LAB — CBC WITH DIFFERENTIAL/PLATELET
Basophils Absolute: 0 10*3/uL (ref 0.0–0.2)
Basos: 1 %
EOS (ABSOLUTE): 0.2 10*3/uL (ref 0.0–0.4)
EOS: 2 %
HEMATOCRIT: 42 % (ref 34.0–46.6)
HEMOGLOBIN: 13.8 g/dL (ref 11.1–15.9)
Immature Grans (Abs): 0 10*3/uL (ref 0.0–0.1)
Immature Granulocytes: 0 %
LYMPHS ABS: 1.8 10*3/uL (ref 0.7–3.1)
Lymphs: 28 %
MCH: 29.9 pg (ref 26.6–33.0)
MCHC: 32.9 g/dL (ref 31.5–35.7)
MCV: 91 fL (ref 79–97)
Monocytes Absolute: 0.4 10*3/uL (ref 0.1–0.9)
Monocytes: 6 %
NEUTROS ABS: 4 10*3/uL (ref 1.4–7.0)
Neutrophils: 63 %
Platelets: 304 10*3/uL (ref 150–379)
RBC: 4.62 x10E6/uL (ref 3.77–5.28)
RDW: 12.6 % (ref 12.3–15.4)
WBC: 6.4 10*3/uL (ref 3.4–10.8)

## 2017-02-19 LAB — CMP14+EGFR
ALBUMIN: 4.7 g/dL (ref 3.5–5.5)
ALT: 11 IU/L (ref 0–32)
AST: 22 IU/L (ref 0–40)
Albumin/Globulin Ratio: 1.7 (ref 1.2–2.2)
Alkaline Phosphatase: 71 IU/L (ref 39–117)
BUN / CREAT RATIO: 13 (ref 9–23)
BUN: 12 mg/dL (ref 6–24)
Bilirubin Total: 0.4 mg/dL (ref 0.0–1.2)
CO2: 24 mmol/L (ref 20–29)
CREATININE: 0.89 mg/dL (ref 0.57–1.00)
Calcium: 9.3 mg/dL (ref 8.7–10.2)
Chloride: 100 mmol/L (ref 96–106)
GFR calc non Af Amer: 76 mL/min/{1.73_m2} (ref >59)
GFR, EST AFRICAN AMERICAN: 88 mL/min/{1.73_m2} (ref >59)
GLUCOSE: 84 mg/dL (ref 65–99)
Globulin, Total: 2.7 g/dL (ref 1.5–4.5)
Potassium: 4 mmol/L (ref 3.5–5.2)
Sodium: 139 mmol/L (ref 134–144)
TOTAL PROTEIN: 7.4 g/dL (ref 6.0–8.5)

## 2017-02-19 LAB — VITAMIN B12: Vitamin B-12: >2000 pg/mL — ABNORMAL HIGH (ref 232–1245)

## 2017-02-19 LAB — LIPID PANEL
CHOL/HDL RATIO: 5.6 ratio — AB (ref 0.0–4.4)
Cholesterol, Total: 242 mg/dL — ABNORMAL HIGH (ref 100–199)
HDL: 43 mg/dL (ref >39)
LDL CALC: 141 mg/dL — AB (ref 0–99)
TRIGLYCERIDES: 290 mg/dL — AB (ref 0–149)
VLDL Cholesterol Cal: 58 mg/dL — ABNORMAL HIGH (ref 5–40)

## 2017-02-19 LAB — TSH: TSH: 1.05 u[IU]/mL (ref 0.450–4.500)

## 2017-02-19 MED ORDER — HYDROXYZINE HCL 25 MG PO TABS: 12.5000 mg | ORAL_TABLET | Freq: Every day | ORAL | 1 refills | Status: DC | PRN

## 2017-02-19 NOTE — Telephone Encounter (Signed)
Copied from [place]. Topic: Quick Communication - Rx Refill/Question >> Feb 19, 2017  9:20 AM Robina Ade, Helene Kelp D wrote: Has the patient contacted their pharmacy? Yes (Agent: If no, request that the patient contact the pharmacy for the refill.) Preferred Pharmacy (with phone number or street name): [facility] [place], [place] their number is [phone]. Agent: Please be advised that RX refills may take up to 3 business days. We ask that you follow-up with your pharmacy. Pharmacy called and is asking to filed patients hydrOXYzine (ATARAX/VISTARIL) 25 MG tablet. She said it was deleted by mistake.

## 2017-02-19 NOTE — Telephone Encounter (Signed)
Spoke   With  [facility]    They  Do  Not  Have  The  [facility]  In  [place]    rx for  The  hydroxine  rx    Written yest   Details  Given    And  He  Will  Send  It  To  The  [facility]   [place]  Who  Do  Have  The  Med  In stock

## 2017-02-19 NOTE — Telephone Encounter (Signed)
Please call patient's pharmacy and let him know I have reordered this prescription.

## 2017-02-20 LAB — PAP IG AND HPV HIGH-RISK
HPV, HIGH-RISK: NEGATIVE
PAP SMEAR COMMENT: 0

## 2017-02-20 NOTE — Telephone Encounter (Signed)
Rx has been resent for pt and pharmacy has received the request.

## 2017-02-26 ENCOUNTER — Encounter: Payer: Self-pay | Admitting: Physician Assistant

## 2017-05-15 ENCOUNTER — Encounter: Payer: Self-pay | Admitting: Physician Assistant

## 2017-05-15 ENCOUNTER — Ambulatory Visit: Payer: BC Managed Care – PPO | Admitting: Physician Assistant

## 2017-05-15 ENCOUNTER — Other Ambulatory Visit: Payer: Self-pay

## 2017-05-15 VITALS — BP 118/70 | HR 75 | Temp 98.3°F | Resp 18 | Ht 64.25 in | Wt 174.4 lb

## 2017-05-15 DIAGNOSIS — N912 Amenorrhea, unspecified: Secondary | ICD-10-CM | POA: Diagnosis not present

## 2017-05-15 DIAGNOSIS — F329 Major depressive disorder, single episode, unspecified: Secondary | ICD-10-CM

## 2017-05-15 DIAGNOSIS — F419 Anxiety disorder, unspecified: Secondary | ICD-10-CM

## 2017-05-15 DIAGNOSIS — F32A Depression, unspecified: Secondary | ICD-10-CM

## 2017-05-15 MED ORDER — CLONAZEPAM 0.25 MG PO TBDP: 0.2500 mg | ORAL_TABLET | Freq: Two times a day (BID) | ORAL | 0 refills | Status: DC | PRN

## 2017-05-15 MED ORDER — ESCITALOPRAM OXALATE 10 MG PO TABS: 20.0000 mg | ORAL_TABLET | Freq: Every day | ORAL | 0 refills | Status: DC

## 2017-05-15 NOTE — Progress Notes (Signed)
Ana Griffin  MRN: [id number] DOB: 13-Aug-1965  Subjective:  Ana Griffin is a 52 y.o. female seen in office today for a chief complaint of follow up on anxiety and depression. On lexapro 10mg . Tried hydroxyzine but had negative rxn: felt super depressed and disorientated with it. Overall, she feels much better.  She does have moments of panic attack a couple times a week.  Is attempting to do coping mechanisms.  Does feel that Klonopin helped with these in the past but does not want to use it daily as she used to.  In terms of depression, she has started making more plans because she actually feels like doing things. Going to event this week and with a friend.Going to [place] state for her birthday. Has exit date for job set in May. Going to therapy every other week and she loves it! Also, no menstrual cycle since 01/2017.  She is not sexually active and has not been for years.  She has occasional hot flashes.  She denies vaginal dryness and sleep disturbance.  Due to her anxiety depression is hard to determine she has mood changes.  Would like to have lab work for further evaluation of this.  Understands that this could be perimenopause.    Review of Systems  Constitutional: Positive for unexpected weight change (some weight gain after intially starting lexapro, has plateaud). Negative for chills and diaphoresis.  Respiratory: Negative for chest tightness.   Cardiovascular: Negative for palpitations.  Gastrointestinal: Negative for abdominal pain, diarrhea, nausea and vomiting.  Neurological: Negative for dizziness and light-headedness.  Psychiatric/Behavioral: Negative for hallucinations, self-injury and suicidal ideas.    Patient Active Problem List   Diagnosis Date Noted  . DUB (dysfunctional uterine bleeding) 10/28/2011  . 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 Depression 10/21/2011  . Weight gain 10/21/2011  . Dysmenorrhea 10/21/2011    Current Outpatient Medications on  File Prior to Visit  Medication Sig Dispense Refill  . Ascorbic Acid (VITAMIN C) 1000 MG tablet Take 1,000 mg by mouth daily.    Marland Kitchen aspirin 81 MG tablet Take 81 mg by mouth daily.    Marland Kitchen b complex vitamins tablet Take 1 tablet by mouth daily.    Marland Kitchen BIOTIN PO Take 1 tablet by mouth daily. Reported on 08/04/2015    . Calcium Carb-Cholecalciferol (CALCIUM 1000 + D PO) Take 1 tablet by mouth daily.    Marland Kitchen escitalopram (LEXAPRO) 10 MG tablet Take 2 tablets (20 mg total) by mouth daily. 180 tablet 0  . fish oil-omega-3 fatty acids 1000 MG capsule Take 2 g by mouth daily.    . Magnesium 500 MG CAPS Take 1 capsule by mouth daily. Reported on 08/04/2015    . clonazePAM (KLONOPIN) 0.5 MG tablet Take 1 tablet (0.5 mg total) by mouth 3 (three) times daily as needed. for anxiety (Patient not taking: Reported on 02/18/2017) 90 tablet 0  . Cyanocobalamin (VITAMIN B 12 PO) Take by mouth.    . hydrOXYzine (ATARAX/VISTARIL) 25 MG tablet Take 0.5-1 tablets (12.5-25 mg total) by mouth daily as needed for anxiety. (Patient not taking: Reported on 05/15/2017) 30 tablet 1   No current facility-administered medications on file prior to visit.     No Known Allergies   Objective:  BP 118/70 (BP Location: Left Arm, Patient Position: Sitting, Cuff Size: Normal)   Pulse 75   Temp 98.3 F (36.8 C) (Oral)   Resp 18   Ht 5' 4.25" (1.632 m)   Wt 174 lb 6.4 oz (  79.1 kg)   LMP 02/21/2017 (Approximate)   SpO2 96%   BMI 29.70 kg/m   Physical Exam  Constitutional: She is oriented to person, place, and time and well-developed, well-nourished, and in no distress.  HENT:  Head: Normocephalic and atraumatic.  Eyes: Conjunctivae are normal.  Neck: Normal range of motion.  Pulmonary/Chest: Effort normal.  Neurological: She is alert and oriented to person, place, and time. Gait normal.  Skin: Skin is warm and dry.  Psychiatric: Affect normal.  Vitals reviewed.    Weight: 174 lb 6.4 oz (79.1 kg)  Wt Readings from Last 3  Encounters:  05/15/17 174 lb 6.4 oz (79.1 kg)  02/18/17 172 lb (78 kg)  12/25/16 168 lb 12.8 oz (76.6 kg)   GAD 7 : Generalized Anxiety Score 05/15/2017 02/18/2017 12/25/2016 12/25/2016  Nervous, Anxious, on Edge 2 2 0 0  Control/stop worrying 2 3 0 1  Worry too much - different things 2 2 0 0  Trouble relaxing 3 3 1 1   Restless 1 0 0 0  Easily annoyed or irritable 1 1 1 1   Afraid - awful might happen 1 1 0 0  Total GAD 7 Score 12 12 2 3   Anxiety Difficulty Somewhat difficult Very difficult - Somewhat difficult    Depression screen [facility] 2/9 05/15/2017 02/18/2017 12/25/2016 08/04/2015 04/04/2015  Decreased Interest 0 1 2 1 3   Down, Depressed, Hopeless 1 1 2 1 3   PHQ - 2 Score 1 2 4 2 6   Altered sleeping 1 3 3  0 1  Tired, decreased energy 1 3 2 2 3   Change in appetite 1 2 3 2 2   Feeling bad or failure about yourself  2 3 1 2 3   Trouble concentrating 0 3 0 2 2  Moving slowly or fidgety/restless 0 1 0 0 0  Suicidal thoughts 1 1 0 1 1  PHQ-9 Score 7 18 13 11 18   Difficult doing work/chores Somewhat difficult Very difficult Somewhat difficult Somewhat difficult Very difficult     Assessment and Plan :  1. Anxiety and depression Depression is well controlled at this time.  Anxiety remains about the same.  Will attempt trial of Klonopin as needed for panic attacks.  Discussed potential addicting ability of Klonopin.   She understands and her goal is not to be using this daily but only as needed. Also encouraged patient that she can increase Lexapro 10 mg to 15 mg or 20 mg if she starts to feel uncontrolled on this dose.  We will follow-up in 3 months.  Continue with therapy. - clonazePAM (KLONOPIN) 0.25 MG disintegrating tablet; Take 1 tablet (0.25 mg total) by mouth 2 (two) times daily as needed for seizure.  Dispense: 30 tablet; Refill: 0 - escitalopram (LEXAPRO) 10 MG tablet; Take 2 tablets (20 mg total) by mouth daily.  Dispense: 180 tablet; Refill: 0  2. Amenorrhea Likely perimenopause.   Labs pending. - Prolactin - TSH - Beta hCG quant (ref lab) - Follicle Stimulating Hormone  A total of 25 was spent in the room with the patient, greater than 50% of which was in counseling/coordination of care regarding depression/anxiety.   Tenna Delaine PA-C  [facility] 05/15/2017 8:53 AM

## 2017-05-15 NOTE — Patient Instructions (Signed)
It  was a pleasure seeing you today.  I recommend continue with Lexapro 10 and increasing to 20 if you feel like you need to.  I have given you prescription  for Klonopin to use as needed for panic attacks.  Patient Ana follow-up in 3 months for reevaluation.  We have collected lab work today and will contact you with these results.   Perimenopause Perimenopause is the time when your body begins to move into the menopause (no menstrual period for 12 straight months). It is a natural process. Perimenopause can begin 2-8 years before the menopause and usually lasts for 1 year after the menopause. During this time, your ovaries may or may not produce an egg. The ovaries vary in their production of estrogen and progesterone hormones each month. This can cause irregular menstrual periods, difficulty getting pregnant, vaginal bleeding between periods, and uncomfortable symptoms. What are the causes?  Irregular production of the ovarian hormones, estrogen and progesterone, and not ovulating every month. Other causes include:  Tumor of the pituitary gland in the brain.  Medical disease that affects the ovaries.  Radiation treatment.  Chemotherapy.  Unknown causes.  Heavy smoking and excessive alcohol intake can bring on perimenopause sooner.  What are the signs or symptoms?  Hot flashes.  Night sweats.  Irregular menstrual periods.  Decreased sex drive.  Vaginal dryness.  Headaches.  Mood swings.  Depression.  Memory problems.  Irritability.  Tiredness.  Weight gain.  Trouble getting pregnant.  The beginning of losing bone cells (osteoporosis).  The beginning of hardening of the arteries (atherosclerosis). How is this diagnosed? Your health care provider will make a diagnosis by analyzing your age, menstrual history, and symptoms. He or she will do a physical exam and note any changes in your body, especially your female organs. Female hormone tests may or may not be  helpful depending on the amount of female hormones you produce and when you produce them. However, other hormone tests may be helpful to rule out other problems. How is this treated? In some cases, no treatment is needed. The decision on whether treatment is necessary during the perimenopause should be made by you and your health care provider based on how the symptoms are affecting you and your lifestyle. Various treatments are available, such as:  Treating individual symptoms with a specific medicine for that symptom.  Herbal medicines that can help specific symptoms.  Counseling.  Group therapy.  Follow these instructions at home:  Keep track of your menstrual periods (when they occur, how heavy they are, how long between periods, and how long they last) as well as your symptoms and when they started.  Only take over-the-counter or prescription medicines as directed by your health care provider.  Sleep and rest.  Exercise.  Eat a diet that contains calcium (good for your bones) and soy (acts like the estrogen hormone).  Do not smoke.  Avoid alcoholic beverages.  Take vitamin supplements as recommended by your health care provider. Taking vitamin E may help in certain cases.  Take calcium and vitamin D supplements to help prevent bone loss.  Group therapy is sometimes helpful.  Acupuncture may help in some cases. Contact a health care provider if:  You have questions about any symptoms you are having.  You need a referral to a specialist (gynecologist, psychiatrist, or psychologist). Get help right away if:  You have vaginal bleeding.  Your period lasts longer than 8 days.  Your periods are recurring sooner than 21  days.  You have bleeding after intercourse.  You have severe depression.  You have pain when you urinate.  You have severe headaches.  You have vision problems. This information is not intended to replace advice given to you by your health care  provider. Make sure you discuss any questions you have with your health care provider. Document Released: 03/20/2004 Document Revised: 07/19/2015 Document Reviewed: 09/09/2012 Elsevier Interactive Patient Education  2017 [employer].

## 2017-05-16 LAB — FOLLICLE STIMULATING HORMONE: FSH: 47.7 m[IU]/mL

## 2017-05-16 LAB — TSH: TSH: 1.39 u[IU]/mL (ref 0.450–4.500)

## 2017-05-16 LAB — BETA HCG QUANT (REF LAB)

## 2017-05-16 LAB — PROLACTIN: PROLACTIN: 7.8 ng/mL (ref 4.8–23.3)

## 2017-06-22 ENCOUNTER — Ambulatory Visit: Payer: BC Managed Care – PPO

## 2018-01-09 ENCOUNTER — Encounter: Payer: Self-pay | Admitting: Physician Assistant

## 2018-04-09 ENCOUNTER — Other Ambulatory Visit: Payer: Self-pay | Admitting: Nurse Practitioner

## 2018-04-09 DIAGNOSIS — Z1231 Encounter for screening mammogram for malignant neoplasm of breast: Secondary | ICD-10-CM

## 2018-04-14 ENCOUNTER — Ambulatory Visit (INDEPENDENT_AMBULATORY_CARE_PROVIDER_SITE_OTHER): Payer: BC Managed Care – PPO | Admitting: Nurse Practitioner

## 2018-04-14 ENCOUNTER — Encounter: Payer: Self-pay | Admitting: Nurse Practitioner

## 2018-04-14 VITALS — BP 118/74 | HR 76 | Temp 97.7°F | Ht 64.0 in | Wt 180.0 lb

## 2018-04-14 DIAGNOSIS — E782 Mixed hyperlipidemia: Secondary | ICD-10-CM | POA: Diagnosis not present

## 2018-04-14 DIAGNOSIS — F332 Major depressive disorder, recurrent severe without psychotic features: Secondary | ICD-10-CM | POA: Diagnosis not present

## 2018-04-14 LAB — LIPID PANEL
Cholesterol: 249 mg/dL — ABNORMAL HIGH (ref 0–200)
HDL: 53.8 mg/dL (ref >39.00)
LDL Cholesterol: 162 mg/dL — ABNORMAL HIGH (ref 0–99)
NONHDL: 195.19
Total CHOL/HDL Ratio: 5
Triglycerides: 167 mg/dL — ABNORMAL HIGH (ref 0.0–149.0)
VLDL: 33.4 mg/dL (ref 0.0–40.0)

## 2018-04-14 LAB — BASIC METABOLIC PANEL
BUN: 9 mg/dL (ref 6–23)
CO2: 28 mEq/L (ref 19–32)
Calcium: 9.7 mg/dL (ref 8.4–10.5)
Chloride: 101 mEq/L (ref 96–112)
Creatinine, Ser: 1.07 mg/dL (ref 0.40–1.20)
GFR: 54.11 mL/min — AB (ref >60.00)
GLUCOSE: 87 mg/dL (ref 70–99)
Potassium: 3.9 mEq/L (ref 3.5–5.1)
Sodium: 138 mEq/L (ref 135–145)

## 2018-04-14 NOTE — Assessment & Plan Note (Addendum)
Chronic, waxing and waning Use of wellbutrin and lexapro in past. Weight gain with lexapro, no adverse effects with wellbutrin Resumed wellbutrin 100mg  daily 46month ago, no improvement in mood Social use of ETOH, no exercise, no particular diet. Use of marijuana once a week, no tobacco use. spents time with family and friend, when not working. Works from home, lives with adult children (adopted).  Resume wellbutrin 200mg  every 12hrs. Contact psychology for counseling sessions

## 2018-04-14 NOTE — Patient Instructions (Addendum)
Take wellbutrin 200mg  every 12hrs. Contact psychology for counseling sessions  Start regular exercise (daily brisk walking 30-70mins, yoga 46mins or weight training).  Wean off green tea supplement due to high caffeine content. Avoid ETOH and marijuana use.  Maintain heart healthy diet. Stable renal function. Abnormal Lipid panel. It is important you changes to your diet and start regular exercise as discussed. Need to repeat labs in 58months (fasting)  Living With Depression Everyone experiences occasional disappointment, sadness, and loss in their lives. When you are feeling down, blue, or sad for at least 2 weeks in a row, it may mean that you have depression. Depression can affect your thoughts and feelings, relationships, daily activities, and physical health. It is caused by changes in the way your brain functions. If you receive a diagnosis of depression, your health care provider will tell you which type of depression you have and what treatment options are available to you. If you are living with depression, there are ways to help you recover from it and also ways to prevent it from coming back. How to cope with lifestyle changes Coping with stress     Stress is your body's reaction to life changes and events, both good and bad. Stressful situations may include:  Getting married.  The death of a spouse.  Losing a job.  Retiring.  Having a baby. Stress can last just a few hours or it can be ongoing. Stress can play a major role in depression, so it is important to learn both how to cope with stress and how to think about it differently. Talk with your health care provider or a counselor if you would like to learn more about stress reduction. He or she may suggest some stress reduction techniques, such as:  Music therapy. This can include creating music or listening to music. Choose music that you enjoy and that inspires you.  Mindfulness-based meditation. This kind of  meditation can be done while sitting or walking. It involves being aware of your normal breaths, rather than trying to control your breathing.  Centering prayer. This is a kind of meditation that involves focusing on a spiritual word or phrase. Choose a word, phrase, or sacred image that is meaningful to you and that brings you peace.  Deep breathing. To do this, expand your stomach and inhale slowly through your nose. Hold your breath for 3-5 seconds, then exhale slowly, allowing your stomach muscles to relax.  Muscle relaxation. This involves intentionally tensing muscles then relaxing them. Choose a stress reduction technique that fits your lifestyle and personality. Stress reduction techniques take time and practice to develop. Set aside 5-15 minutes a day to do them. Therapists can offer training in these techniques. The training may be covered by some insurance plans. Other things you can do to manage stress include:  Keeping a stress diary. This can help you learn what triggers your stress and ways to control your response.  Understanding what your limits are and saying no to requests or events that lead to a schedule that is too full.  Thinking about how you respond to certain situations. You may not be able to control everything, but you can control how you react.  Adding humor to your life by watching funny films or TV shows.  Making time for activities that help you relax and not feeling guilty about spending your time this way.  Medicines Your health care provider may suggest certain medicines if he or she feels that they will  help improve your condition. Avoid using alcohol and other substances that may prevent your medicines from working properly (may interact). It is also important to:  Talk with your pharmacist or health care provider about all the medicines that you take, their possible side effects, and what medicines are safe to take together.  Make it your goal to take  part in all treatment decisions (shared decision-making). This includes giving input on the side effects of medicines. It is best if shared decision-making with your health care provider is part of your total treatment plan. If your health care provider prescribes a medicine, you may not notice the full benefits of it for 4-8 weeks. Most people who are treated for depression need to be on medicine for at least 6-12 months after they feel better. If you are taking medicines as part of your treatment, do not stop taking medicines without first talking to your health care provider. You may need to have the medicine slowly decreased (tapered) over time to decrease the risk of harmful side effects. Relationships Your health care provider may suggest family therapy along with individual therapy and drug therapy. While there may not be family problems that are causing you to feel depressed, it is still important to make sure your family learns as much as they can about your mental health. Having your family's support can help make your treatment successful. How to recognize changes in your condition Everyone has a different response to treatment for depression. Recovery from major depression happens when you have not had signs of major depression for two months. This may mean that you will start to:  Have more interest in doing activities.  Feel less hopeless than you did 2 months ago.  Have more energy.  Overeat less often, or have better or improving appetite.  Have better concentration. Your health care provider will work with you to decide the next steps in your recovery. It is also important to recognize when your condition is getting worse. Watch for these signs:  Having fatigue or low energy.  Eating too much or too little.  Sleeping too much or too little.  Feeling restless, agitated, or hopeless.  Having trouble concentrating or making decisions.  Having unexplained physical  complaints.  Feeling irritable, angry, or aggressive. Get help as soon as you or your family members notice these symptoms coming back. How to get support and help from others How to talk with friends and family members about your condition  Talking to friends and family members about your condition can provide you with one way to get support and guidance. Reach out to trusted friends or family members, explain your symptoms to them, and let them know that you are working with a health care provider to treat your depression. Financial resources Not all insurance plans cover mental health care, so it is important to check with your insurance carrier. If paying for co-pays or counseling services is a problem, search for a local or county mental health care center. They may be able to offer public mental health care services at low or no cost when you are not able to see a private health care provider. If you are taking medicine for depression, you may be able to get the generic form, which may be less expensive. Some makers of prescription medicines also offer help to patients who cannot afford the medicines they need. Follow these instructions at home:   Get the right amount and quality of sleep.  Cut  down on using caffeine, tobacco, alcohol, and other potentially harmful substances.  Try to exercise, such as walking or lifting small weights.  Take over-the-counter and prescription medicines only as told by your health care provider.  Eat a healthy diet that includes plenty of vegetables, fruits, whole grains, low-fat dairy products, and lean protein. Do not eat a lot of foods that are high in solid fats, added sugars, or salt.  Keep all follow-up visits as told by your health care provider. This is important. Contact a health care provider if:  You stop taking your antidepressant medicines, and you have any of these symptoms: ? Nausea. ? Headache. ? Feeling lightheaded. ? Chills and  body aches. ? Not being able to sleep (insomnia).  You or your friends and family think your depression is getting worse. Get help right away if:  You have thoughts of hurting yourself or others. If you ever feel like you may hurt yourself or others, or have thoughts about taking your own life, get help right away. You can go to your nearest emergency department or call:  Your local emergency services (911 in the [place]  A suicide crisis helpline, such as the [facility] at 775-461-8512. This is open 24-hours a day. Summary  If you are living with depression, there are ways to help you recover from it and also ways to prevent it from coming back.  Work with your health care team to create a management plan that includes counseling, stress management techniques, and healthy lifestyle habits. This information is not intended to replace advice given to you by your health care provider. Make sure you discuss any questions you have with your health care provider. Document Released: 01/14/2016 Document Revised: 01/14/2016 Document Reviewed: 01/14/2016 Elsevier Interactive Patient Education  2019 [employer].

## 2018-04-14 NOTE — Progress Notes (Addendum)
Subjective:  Patient ID: Ana Griffin, female    DOB: 1965/05/22  Age: 53 y.o. MRN: [id number]  CC: Establish Care (est care/medications consult. )   HPI Anxiety and Depression:  Chronic, waxing and waning Use of wellbutrin and lexapro in past. Weight gain with lexapro, no adverse effects with wellbutrin Resumed wellbutrin 100mg  daily 31month ago, no improvement in mood Social use of ETOH, no exercise, no particular diet. Use of marijuana once a week, no tobacco use. spents time with family and friend, when not working. Works from home, lives with adult Griffin (adopted). Depression screen Ana Griffin 2/9 04/14/2018 04/14/2018 05/15/2017  Decreased Interest 1 0 0  Down, Depressed, Hopeless 1 0 1  PHQ - 2 Score 2 0 1  Altered sleeping 2 - 1  Tired, decreased energy 2 - 1  Change in appetite 1 - 1  Feeling bad or failure about yourself  1 - 2  Trouble concentrating 1 - 0  Moving slowly or fidgety/restless 0 - 0  Suicidal thoughts 0 - 1  PHQ-9 Score 9 - 7  Difficult doing work/chores - - Somewhat difficult   GAD 7 : Generalized Anxiety Score 04/14/2018 05/15/2017 02/18/2017 12/25/2016  Nervous, Anxious, on Edge 0 2 2 0  Control/stop worrying 0 2 3 0  Worry too much - different things 0 2 2 0  Trouble relaxing 1 3 3 1   Restless 1 1 0 0  Easily annoyed or irritable 1 1 1 1   Afraid - awful might happen 0 1 1 0  Total GAD 7 Score 3 12 12 2   Anxiety Difficulty - Somewhat difficult Very difficult -    Reviewed past Medical, Social and Family history today.  Outpatient Medications Prior to Visit  Medication Sig Dispense Refill  . aspirin 81 MG tablet Take 81 mg by mouth daily.    Marland Kitchen b complex vitamins tablet Take 1 tablet by mouth daily.    . Calcium-Magnesium-Vitamin D (CALCIUM MAGNESIUM PO) Take 333 mg by mouth.    . Cholecalciferol (VITAMIN D3) 50 MCG (2000 UT) TABS Take by mouth.    Marland Kitchen CINNAMON PO Take 2,000 mg by mouth.    . Cyanocobalamin (VITAMIN B 12 PO) Take by mouth.    .  fish oil-omega-3 fatty acids 1000 MG capsule Take 2 g by mouth daily.    Nyoka Cowden Tea, Camellia sinensis, (GREEN TEA PO) Take 400 mg by mouth.    Marland Kitchen OVER THE COUNTER MEDICATION Thyroid Support Thomas Hoff) Stress support    . OVER THE COUNTER MEDICATION 553 mg. Tumeric    . OVER THE COUNTER MEDICATION 500 mg. Alvan Dame    . OVER THE COUNTER MEDICATION Allegra--PRN    . Probiotic Product (PROBIOTIC DAILY PO) Take by mouth.    Marland Kitchen buPROPion HCl (WELLBUTRIN PO) Take 200 mg by mouth. Take 1/2    . Ascorbic Acid (VITAMIN C) 1000 MG tablet Take 1,000 mg by mouth daily.    Marland Kitchen BIOTIN PO Take 1 tablet by mouth daily. Reported on 08/04/2015    . Calcium Carb-Cholecalciferol (CALCIUM 1000 + D PO) Take 1 tablet by mouth daily.    . Magnesium 500 MG CAPS Take 1 capsule by mouth daily. Reported on 08/04/2015    . clonazePAM (KLONOPIN) 0.25 MG disintegrating tablet Take 1 tablet (0.25 mg total) by mouth 2 (two) times daily as needed for seizure. (Patient not taking: Reported on 04/14/2018) 30 tablet 0  . escitalopram (LEXAPRO) 10 MG tablet Take 2 tablets (20 mg  total) by mouth daily. (Patient not taking: Reported on 04/14/2018) 180 tablet 0  . hydrOXYzine (ATARAX/VISTARIL) 25 MG tablet Take 0.5-1 tablets (12.5-25 mg total) by mouth daily as needed for anxiety. (Patient not taking: Reported on 05/15/2017) 30 tablet 1   No facility-administered medications prior to visit.     ROS See HPI  Objective:  BP 118/74   Pulse 76   Temp 97.7 F (36.5 C) (Oral)   Ht 5\' 4"  (1.626 m)   Wt 180 lb (81.6 kg)   SpO2 97%   BMI 30.90 kg/m   BP Readings from Last 3 Encounters:  04/14/18 118/74  05/15/17 118/70  02/18/17 116/70    Wt Readings from Last 3 Encounters:  04/14/18 180 lb (81.6 kg)  05/15/17 174 lb 6.4 oz (79.1 kg)  02/18/17 172 lb (78 kg)    Physical Exam Psychiatric:        Attention and Perception: Attention normal.        Mood and Affect: Affect is tearful.        Speech: Speech normal.        Behavior:  Behavior normal. Behavior is cooperative.        Thought Content: Thought content normal.        Cognition and Memory: Cognition and memory normal.        Judgment: Judgment normal.    Lab Results  Component Value Date   WBC 6.4 02/18/2017   HGB 13.8 02/18/2017   HCT 42.0 02/18/2017   PLT 304 02/18/2017   GLUCOSE 87 04/14/2018   CHOL 249 (H) 04/14/2018   TRIG 167.0 (H) 04/14/2018   HDL 53.80 04/14/2018   LDLCALC 162 (H) 04/14/2018   ALT 11 02/18/2017   AST 22 02/18/2017   NA 138 04/14/2018   K 3.9 04/14/2018   CL 101 04/14/2018   CREATININE 1.07 04/14/2018   BUN 9 04/14/2018   CO2 28 04/14/2018   TSH 1.390 05/15/2017   HGBA1C 5.5 09/08/2014    Assessment & Plan:   [name] was seen today for establish care.  Diagnoses and all orders for this visit:  Severe episode of recurrent major depressive disorder, without psychotic features ([place]) -     Basic metabolic panel  Mixed hyperlipidemia -     Lipid panel   I have discontinued [name]'s hydrOXYzine, clonazePAM, and escitalopram. I am also having her maintain her aspirin, fish oil-omega-3 fatty acids, Cyanocobalamin (VITAMIN B 12 PO), vitamin C, Calcium Carb-Cholecalciferol (CALCIUM 1000 + D PO), BIOTIN PO, Magnesium, b complex vitamins, (Green Tea, Camellia sinensis, (GREEN TEA PO)), Calcium-Magnesium-Vitamin D (CALCIUM MAGNESIUM PO), OVER THE COUNTER MEDICATION, Vitamin D3, OVER THE COUNTER MEDICATION, CINNAMON PO, OVER THE COUNTER MEDICATION, Probiotic Product (PROBIOTIC DAILY PO), and OVER THE COUNTER MEDICATION.  No orders of the defined types were placed in this encounter.   Problem List Items Addressed This Visit      Other   Depression - Primary    Chronic, waxing and waning Use of wellbutrin and lexapro in past. Weight gain with lexapro, no adverse effects with wellbutrin Resumed wellbutrin 100mg  daily 15month ago, no improvement in mood Social use of ETOH, no exercise, no particular diet. Use of  marijuana once a week, no tobacco use. spents time with family and friend, when not working. Works from home, lives with adult Griffin (adopted).  Resume wellbutrin 200mg  every 12hrs. Contact psychology for counseling sessions       Relevant Orders   Basic metabolic panel (Completed)  Mixed hyperlipidemia   Relevant Orders   Lipid panel (Completed)       Follow-up: Return in about 4 weeks (around 05/12/2018) for depression (41mins).  Wilfred Lacy, NP

## 2018-04-15 DIAGNOSIS — E782 Mixed hyperlipidemia: Secondary | ICD-10-CM | POA: Insufficient documentation

## 2018-04-15 MED ORDER — BUPROPION HCL ER (XL) 150 MG PO TB24: 150.0000 mg | ORAL_TABLET | Freq: Every day | ORAL | 1 refills | Status: DC

## 2018-05-07 ENCOUNTER — Other Ambulatory Visit: Payer: Self-pay

## 2018-05-07 ENCOUNTER — Ambulatory Visit
Admission: RE | Admit: 2018-05-07 | Discharge: 2018-05-07 | Disposition: A | Discharge status: Home / Self Care | Payer: BC Managed Care – PPO | Source: Ambulatory Visit | Attending: Nurse Practitioner | Admitting: Nurse Practitioner

## 2018-05-07 ENCOUNTER — Encounter: Payer: Self-pay | Admitting: Nurse Practitioner

## 2018-05-07 DIAGNOSIS — Z1231 Encounter for screening mammogram for malignant neoplasm of breast: Secondary | ICD-10-CM

## 2018-05-12 ENCOUNTER — Ambulatory Visit: Payer: BC Managed Care – PPO | Admitting: Nurse Practitioner

## 2018-06-07 ENCOUNTER — Other Ambulatory Visit: Payer: Self-pay | Admitting: Nurse Practitioner

## 2018-06-07 DIAGNOSIS — F332 Major depressive disorder, recurrent severe without psychotic features: Secondary | ICD-10-CM

## 2018-06-07 NOTE — Telephone Encounter (Signed)
Left vm for the pt to call back, need Doxy/video visit with PCP.

## 2018-06-25 ENCOUNTER — Telehealth: Payer: Self-pay | Admitting: Nurse Practitioner

## 2018-06-25 NOTE — Telephone Encounter (Signed)
Called and could not leave a vm. Calling to schedule virtual visit with Baldo Ash.

## 2018-07-07 ENCOUNTER — Ambulatory Visit (INDEPENDENT_AMBULATORY_CARE_PROVIDER_SITE_OTHER): Payer: BC Managed Care – PPO | Admitting: Nurse Practitioner

## 2018-07-07 ENCOUNTER — Other Ambulatory Visit: Payer: Self-pay | Admitting: Nurse Practitioner

## 2018-07-07 ENCOUNTER — Encounter: Payer: Self-pay | Admitting: Nurse Practitioner

## 2018-07-07 DIAGNOSIS — F3341 Major depressive disorder, recurrent, in partial remission: Secondary | ICD-10-CM | POA: Diagnosis not present

## 2018-07-07 MED ORDER — BUPROPION HCL ER (XL) 150 MG PO TB24: 150.0000 mg | ORAL_TABLET | Freq: Every day | ORAL | 3 refills | Status: DC

## 2018-07-07 NOTE — Progress Notes (Signed)
Virtual Visit via Video Note  I connected with Ana Griffin on 07/07/18 at  2:00 PM EDT by a video enabled telemedicine application and verified that I am speaking with the correct person using two identifiers.  Location: Patient: home Provider: office   I discussed the limitations of evaluation and management by telemedicine and the availability of in person appointments. The patient expressed understanding and agreed to proceed.  [id number] follow up bupropion--med is working  History of Present Illness:  Reports improved and stable mood with current dose of wellbutrin. Depression screen [facility] 2/9 07/07/2018 04/14/2018 04/14/2018  Decreased Interest 0 1 0  Down, Depressed, Hopeless 1 1 0  PHQ - 2 Score 1 2 0  Altered sleeping 1 2 -  Tired, decreased energy 0 2 -  Change in appetite 0 1 -  Feeling bad or failure about yourself  0 1 -  Trouble concentrating 0 1 -  Moving slowly or fidgety/restless 0 0 -  Suicidal thoughts 0 0 -  PHQ-9 Score 2 9 -  Difficult doing work/chores - - -   GAD 7 : Generalized Anxiety Score 07/07/2018 04/14/2018 05/15/2017 02/18/2017  Nervous, Anxious, on Edge 0 0 2 2  Control/stop worrying 1 0 2 3  Worry too much - different things 0 0 2 2  Trouble relaxing 0 1 3 3   Restless 0 1 1 0  Easily annoyed or irritable 1 1 1 1   Afraid - awful might happen 0 0 1 1  Total GAD 7 Score 2 3 12 12   Anxiety Difficulty - - Somewhat difficult Very difficult   Observations/Objective: Physical Exam  Constitutional: She is oriented to person, place, and time. No distress.  Pulmonary/Chest: Effort normal.  Neurological: She is alert and oriented to person, place, and time.  Psychiatric: She has a normal mood and affect. Her behavior is normal. Thought content normal.  Vitals reviewed.  Assessment and Plan: Ana Griffin was seen today for follow-up.  Diagnoses and all orders for this visit:  Recurrent major depressive disorder, in partial remission ([place]) -      buPROPion (WELLBUTRIN XL) 150 MG 24 hr tablet; Take 1 tablet (150 mg total) by mouth daily.   Follow Up Instructions: Continue current medications, regular exercise and DASH diet. F/up in 32months for CPE and repeat labs   I discussed the assessment and treatment plan with the patient. The patient was provided an opportunity to ask questions and all were answered. The patient agreed with the plan and demonstrated an understanding of the instructions.   The patient was advised to call back or seek an in-person evaluation if the symptoms worsen or if the condition fails to improve as anticipated.   Wilfred Lacy, NP

## 2018-07-07 NOTE — Patient Instructions (Addendum)
Continue current medications, regular exercise and DASH diet. F/up in 22months for CPE and repeat labs

## 2018-12-06 ENCOUNTER — Encounter: Payer: BC Managed Care – PPO | Admitting: Nurse Practitioner

## 2018-12-07 ENCOUNTER — Telehealth: Payer: Self-pay | Admitting: Nurse Practitioner

## 2018-12-07 NOTE — Telephone Encounter (Signed)

## 2018-12-08 ENCOUNTER — Encounter: Payer: Self-pay | Admitting: Nurse Practitioner

## 2018-12-08 ENCOUNTER — Ambulatory Visit (INDEPENDENT_AMBULATORY_CARE_PROVIDER_SITE_OTHER): Payer: BC Managed Care – PPO | Admitting: Nurse Practitioner

## 2018-12-08 ENCOUNTER — Encounter: Payer: BC Managed Care – PPO | Admitting: Nurse Practitioner

## 2018-12-08 ENCOUNTER — Other Ambulatory Visit: Payer: Self-pay

## 2018-12-08 VITALS — BP 100/70 | HR 61 | Temp 96.6°F | Ht 64.0 in | Wt 179.6 lb

## 2018-12-08 DIAGNOSIS — Z0000 Encounter for general adult medical examination without abnormal findings: Secondary | ICD-10-CM | POA: Diagnosis not present

## 2018-12-08 DIAGNOSIS — Z800 Family history of malignant neoplasm of digestive organs: Secondary | ICD-10-CM | POA: Diagnosis not present

## 2018-12-08 DIAGNOSIS — Z23 Encounter for immunization: Secondary | ICD-10-CM

## 2018-12-08 DIAGNOSIS — Z0001 Encounter for general adult medical examination with abnormal findings: Secondary | ICD-10-CM

## 2018-12-08 DIAGNOSIS — F410 Panic disorder [episodic paroxysmal anxiety] without agoraphobia: Secondary | ICD-10-CM

## 2018-12-08 DIAGNOSIS — Z1211 Encounter for screening for malignant neoplasm of colon: Secondary | ICD-10-CM | POA: Diagnosis not present

## 2018-12-08 DIAGNOSIS — E782 Mixed hyperlipidemia: Secondary | ICD-10-CM

## 2018-12-08 DIAGNOSIS — F3341 Major depressive disorder, recurrent, in partial remission: Secondary | ICD-10-CM | POA: Diagnosis not present

## 2018-12-08 DIAGNOSIS — D649 Anemia, unspecified: Secondary | ICD-10-CM

## 2018-12-08 LAB — TSH: TSH: 2.02 u[IU]/mL (ref 0.35–4.50)

## 2018-12-08 LAB — COMPREHENSIVE METABOLIC PANEL
ALT: 11 U/L (ref 0–35)
AST: 20 U/L (ref 0–37)
Albumin: 4.3 g/dL (ref 3.5–5.2)
Alkaline Phosphatase: 72 U/L (ref 39–117)
BUN: 16 mg/dL (ref 6–23)
CO2: 30 mEq/L (ref 19–32)
Calcium: 9.5 mg/dL (ref 8.4–10.5)
Chloride: 100 mEq/L (ref 96–112)
Creatinine, Ser: 1.07 mg/dL (ref 0.40–1.20)
GFR: 53.97 mL/min — ABNORMAL LOW (ref >60.00)
Glucose, Bld: 97 mg/dL (ref 70–99)
Potassium: 4.2 mEq/L (ref 3.5–5.1)
Sodium: 138 mEq/L (ref 135–145)
Total Bilirubin: 0.3 mg/dL (ref 0.2–1.2)
Total Protein: 7.1 g/dL (ref 6.0–8.3)

## 2018-12-08 LAB — CBC
HCT: 31.2 % — ABNORMAL LOW (ref 36.0–46.0)
Hemoglobin: 10.4 g/dL — ABNORMAL LOW (ref 12.0–15.0)
MCHC: 33.3 g/dL (ref 30.0–36.0)
MCV: 82.8 fl (ref 78.0–100.0)
Platelets: 273 10*3/uL (ref 150.0–400.0)
RBC: 3.77 Mil/uL — ABNORMAL LOW (ref 3.87–5.11)
RDW: 14.6 % (ref 11.5–15.5)
WBC: 4.3 10*3/uL (ref 4.0–10.5)

## 2018-12-08 LAB — LIPID PANEL
Cholesterol: 221 mg/dL — ABNORMAL HIGH (ref 0–200)
HDL: 47.1 mg/dL (ref >39.00)
LDL Cholesterol: 135 mg/dL — ABNORMAL HIGH (ref 0–99)
NonHDL: 174.04
Total CHOL/HDL Ratio: 5
Triglycerides: 194 mg/dL — ABNORMAL HIGH (ref 0.0–149.0)
VLDL: 38.8 mg/dL (ref 0.0–40.0)

## 2018-12-08 MED ORDER — BUSPIRONE HCL 5 MG PO TABS: 5.0000 mg | ORAL_TABLET | Freq: Two times a day (BID) | ORAL | 2 refills | Status: DC | PRN

## 2018-12-08 NOTE — Progress Notes (Addendum)
Subjective:    Patient ID: Ana Griffin, female    DOB: 1965/05/26, 53 y.o.   MRN: [id number]  Patient presents today for complete physical and eval of chronic condition  HPI  Sexual History (orientation,birth control, marital status, STD):not sexually active, post menopausal, up to date with PAP and mammogram  Depression/Suicide:stable with intermittent episodes of increased anxiety (racing thoughts, palpitations, and interrupted sleep). She does not want to take vistaril or benzodiazepine due to side effects (worsening depression). Depression screen [facility] 2/9 12/08/2018 07/07/2018 04/14/2018 04/14/2018 05/15/2017 02/18/2017 12/25/2016  Decreased Interest 0 0 1 0 0 1 2  Down, Depressed, Hopeless 0 1 1 0 _0 PHQ - 2 Score 0 1 2 0 _1 Altered sleeping - 1 2 - _2 Tired, decreased energy - 0 2 - _3 Change in appetite - 0 1 - _4 Feeling bad or failure about yourself  - 0 1 - _5 Trouble concentrating - 0 1 - 0 3 0  Moving slowly or fidgety/restless - 0 0 - 0 1 0  Suicidal thoughts - 0 0 - 1 1 0  PHQ-9 Score - 2 9 - _6 Difficult doing work/chores - - - - Somewhat difficult Very difficult Somewhat difficult   GAD 7 : Generalized Anxiety Score 07/07/2018 04/14/2018 05/15/2017 02/18/2017  Nervous, Anxious, on Edge 0 0 2 2  Control/stop worrying 1 0 2 3  Worry too much - different things 0 0 2 2  Trouble relaxing 0 _7 Restless 0 1 1 0  Easily annoyed or irritable _8 Afraid - awful might happen 0 0 1 1  Total GAD 7 Score _9 Anxiety Difficulty - - Somewhat difficult Very difficult   Vision:up to date  Dental:up to date  Immunizations: (TDAP, Hep C screen, Pneumovax, Influenza, zoster)  Health Maintenance  Topic Date Due  . Colon Cancer Screening  07/04/2017  . Flu Shot  09/25/2018  . HIV Screening  12/08/2019*  . Pap Smear  02/19/2020  . Mammogram  05/06/2020  . Tetanus Vaccine  10/20/2021  *Topic was postponed. The date shown is not the  original due date.   Diet:low fat and low carb.  Weight:  Wt Readings from Last 3 Encounters:  12/08/18 179 lb 9.6 oz (81.5 kg)  07/07/18 187 lb (84.8 kg)  04/14/18 180 lb (81.6 kg)   Exercise:walking daily for 23mns  Fall Risk: Fall Risk  12/08/2018 04/14/2018 05/15/2017 02/18/2017 12/25/2016 08/04/2015  Falls in the past year? 0 0 No No No No   Medications and allergies reviewed with patient and updated if appropriate.  Patient Active Problem List   Diagnosis Date Noted  . Family history of malignant neoplasm of colon in first degree relative diagnosed when younger than 51years of age 61/14/2020  . Panic attacks 12/08/2018  . Mixed hyperlipidemia 04/15/2018  . DUB (dysfunctional uterine bleeding) 10/28/2011  . 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 Depression 10/21/2011  . Weight gain 10/21/2011  . Dysmenorrhea 10/21/2011    Current Outpatient Medications on File Prior to Visit  Medication Sig Dispense Refill  . Ascorbic Acid (VITAMIN C) 1000 MG tablet Take 1,000 mg by mouth daily.    .Marland Kitchenaspirin 81 MG tablet Take 81 mg by mouth daily.    .Marland Kitchenb complex vitamins tablet Take 1 tablet by mouth daily.    .Marland Kitchen  BIOTIN PO Take 1 tablet by mouth daily. Reported on 08/04/2015    . buPROPion (WELLBUTRIN XL) 150 MG 24 hr tablet Take 1 tablet (150 mg total) by mouth daily. 90 tablet 3  . Calcium Carb-Cholecalciferol (CALCIUM 1000 + D PO) Take 1 tablet by mouth daily.    . Calcium-Magnesium-Vitamin D (CALCIUM MAGNESIUM PO) Take 333 mg by mouth.    . Cholecalciferol (VITAMIN D3) 50 MCG (2000 UT) TABS Take by mouth.    Marland Kitchen CINNAMON PO Take 2,000 mg by mouth.    . Cyanocobalamin (VITAMIN B 12 PO) Take by mouth.    . Ferrous Sulfate (IRON PO) Take 150 mg by mouth.    . fish oil-omega-3 fatty acids 1000 MG capsule Take 2 g by mouth daily.    Nyoka Cowden Tea, Camellia sinensis, (GREEN TEA PO) Take 400 mg by mouth.    . Magnesium 500 MG CAPS Take 1 capsule by mouth daily. Reported on 08/04/2015    . OVER  THE COUNTER MEDICATION Thyroid Support Thomas Hoff) Stress support    . OVER THE COUNTER MEDICATION 553 mg. Tumeric    . OVER THE COUNTER MEDICATION 500 mg. Alvan Dame    . OVER THE COUNTER MEDICATION Allegra--PRN    . Probiotic Product (PROBIOTIC DAILY PO) Take by mouth.     No current facility-administered medications on file prior to visit.     Past Medical History:  Diagnosis Date  . Anxiety   . 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 Depression   . Heart murmur     Past Surgical History:  Procedure Laterality Date  . CERVICAL BIOPSY  W/ LOOP ELECTRODE EXCISION  01/2002  . COLPOSCOPY      Social History   Socioeconomic History  . Marital status: Single    Spouse name: Not on file  . Number of children: 2  . Years of education: Not on file  . Highest education level: Bachelor's degree (e.g., BA, AB, BS)  Occupational History  . 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 Financial resource strain: Not on file  . Food insecurity    Worry: Not on file    Inability: Not on file  . Transportation needs    Medical: Not on file    Non-medical: Not on file  Tobacco Use  . Smoking status: Former Smoker    Types: Cigars    Quit date: 10/21/2003    Years since quitting: 15.1  . Smokeless tobacco: Never Used  Substance and Sexual Activity  . Alcohol use: Yes    Comment: OCC  . Drug use: Yes    Types: Marijuana    Comment: occassionally, 3x a year  . Sexual activity: Never    Partners: Female, Female    Birth control/protection: None    Comment: It has been over 10 years since she has been sexually active  Lifestyle  . Physical activity    Days per week: 0 days    Minutes per session: 0 min  . Stress: Rather much  Relationships  . Social Herbalist on phone: Once a week    Gets together: Once a week    Attends religious service: More than 4 times per year    Active member of club or organization: No    Attends meetings of clubs or  organizations: Never    Relationship status: Never married  Other Topics Concern  . Not on file  Social  History Narrative   Moved here from [place] in 1992. Lives at home with 2 adopted children and grandchildren.       Diet: Eats "unhealthy things." Likes frozen pizza, breads, [place], processed food. Caffeine intake has increased, doing a redbull a day. Takes multivitamins daily.       Not currently engaging in structured exercise.     Family History  Problem Relation Age of Onset  . Hypertension Mother   . Diabetes Mother   . Stroke Mother   . COPD Mother   . Heart failure Mother   . Mental illness Mother   . Cancer Father        Lung cancer  . Breast cancer Maternal Aunt 52  . Cancer Maternal Aunt        BRAIN  . Cancer Brother 52       colon cancer        Review of Systems  Constitutional: Negative for fever, malaise/fatigue and weight loss.  HENT: Negative for congestion and sore throat.   Eyes:       Negative for visual changes  Respiratory: Negative for cough and shortness of breath.   Cardiovascular: Negative for chest pain, palpitations and leg swelling.  Gastrointestinal: Negative for blood in stool, constipation, diarrhea and heartburn.  Genitourinary: Negative for dysuria, frequency and urgency.  Musculoskeletal: Negative for falls, joint pain and myalgias.  Skin: Negative for rash.  Neurological: Negative for dizziness, sensory change and headaches.  Endo/Heme/Allergies: Does not bruise/bleed easily.  Psychiatric/Behavioral: Positive for depression. Negative for hallucinations, memory loss, substance abuse and suicidal ideas. The patient is nervous/anxious and has insomnia.     Objective:   Vitals:   12/08/18 0917  BP: 100/70  Pulse: 61  Temp: (!) 96.6 F (35.9 C)  SpO2: 98%    Body mass index is 30.83 kg/m.   Physical Examination:  Physical Exam Constitutional:      Appearance: She is obese.  HENT:     Right Ear: Tympanic membrane, ear  canal and external ear normal.     Left Ear: Tympanic membrane, ear canal and external ear normal.     Mouth/Throat:     Pharynx: No oropharyngeal exudate.  Eyes:     General: No scleral icterus.    Extraocular Movements: Extraocular movements intact.     Conjunctiva/sclera: Conjunctivae normal.  Neck:     Musculoskeletal: Normal range of motion and neck supple.     Thyroid: No thyromegaly.  Cardiovascular:     Rate and Rhythm: Normal rate and regular rhythm.     Pulses: Normal pulses.     Heart sounds: Normal heart sounds.  Pulmonary:     Effort: Pulmonary effort is normal.     Breath sounds: Normal breath sounds.  Chest:     Chest wall: No tenderness.  Abdominal:     General: Bowel sounds are normal. There is no distension.     Palpations: Abdomen is soft.     Tenderness: There is no abdominal tenderness.  Genitourinary:    Comments: Patient deferred breast and pelvic exam to GYN Musculoskeletal: Normal range of motion.        General: No tenderness.  Lymphadenopathy:     Cervical: No cervical adenopathy.  Skin:    General: Skin is warm and dry.  Neurological:     Mental Status: She is alert and oriented to person, place, and time.  Psychiatric:        Judgment: Judgment normal.  ASSESSMENT and PLAN:  Ana Griffin was seen today for annual exam.  Diagnoses and all orders for this visit:  Encounter for preventative adult health care exam with abnormal findings -     Comprehensive metabolic panel -     TSH -     CBC  Mixed hyperlipidemia -     Lipid panel  Recurrent major depressive disorder, in partial remission (HCC)  Family history of malignant neoplasm of colon in first degree relative diagnosed when younger than 51 years of age -     Ambulatory referral to Gastroenterology  Panic attacks -     busPIRone (BUSPAR) 5 MG tablet; Take 1 tablet (5 mg total) by mouth 2 (two) times daily as needed.  Colon cancer screening -     Ambulatory referral to  Gastroenterology  Need for MMR vaccine -     MMR vaccine subcutaneous  Anemia, unspecified type -     Cancel: CBC w/Diff; Future -     Cancel: IBC panel; Future -     CBC w/Diff; Future -     IBC panel; Future    Depression Chronic, waxing and waning Use of wellbutrin and lexapro in past. Weight gain with lexapro, no adverse effects with wellbutrin Resumed wellbutrin 155m daily 110monthgo, no improvement in mood Social use of ETOH, no exercise, no particular diet. Use of marijuana once a week, no tobacco use. spents time with family and friend, when not working. Works from home, lives with adult children (adopted).  She reports improved mood with wellbutrin XL 1502maily F/up in 73mo78monthanic attacks stable with intermittent episodes of increased anxiety (racing thoughts, palpitations, and interrupted sleep). She does not want to take vistaril or benzodiazepine due to side effects (worsening depression).  Start buspar 5mg 42m prn. She was advised anout possible side effects (dizziness and headache) F/up in 73mont40month Problem List Items Addressed This Visit      Other   Depression    Chronic, waxing and waning Use of wellbutrin and lexapro in past. Weight gain with lexapro, no adverse effects with wellbutrin Resumed wellbutrin 100mg d73m 248month 57month improvement in mood Social use of ETOH, no exercise, no particular diet. Use of marijuana once a week, no tobacco use. spents time with family and friend, when not working. Works from home, lives with adult children (adopted).  She reports improved mood with wellbutrin XL 150mg dai68m/up in 748months  34monthevant Medications   busPIRone (BUSPAR) 5 MG tablet   Family history of malignant neoplasm of colon in first degree relative diagnosed when younger than 60 years o21age   Relevant Orders   Ambulatory referral to Gastroenterology   Mixed hyperlipidemia   Relevant Orders   Lipid panel (Completed)    Panic attacks    stable with intermittent episodes of increased anxiety (racing thoughts, palpitations, and interrupted sleep). She does not want to take vistaril or benzodiazepine due to side effects (worsening depression).  Start buspar 5mg BID pr36mShe was advised anout possible side effects (dizziness and headache) F/up in 748month     28monthant Medications   busPIRone (BUSPAR) 5 MG tablet    Other Visit Diagnoses    Encounter for preventative adult health care exam with abnormal findings    -  Primary   Relevant Orders   Comprehensive metabolic panel (Completed)   TSH (Completed)   CBC (Completed)   Colon cancer screening  Relevant Orders   Ambulatory referral to Gastroenterology   Need for MMR vaccine       Relevant Orders   MMR vaccine subcutaneous (Completed)   Anemia, unspecified type       Relevant Medications   Ferrous Sulfate (IRON PO)   Other Relevant Orders   CBC w/Diff   IBC panel       Follow up: Return in about 3 months (around 03/10/2019) for anxiety and depression.  Wilfred Lacy, NP

## 2018-12-08 NOTE — Assessment & Plan Note (Signed)
stable with intermittent episodes of increased anxiety (racing thoughts, palpitations, and interrupted sleep). She does not want to take vistaril or benzodiazepine due to side effects (worsening depression).  Start buspar 5mg  BID prn. She was advised anout possible side effects (dizziness and headache) F/up in 18month

## 2018-12-08 NOTE — Addendum Note (Signed)
Addended by: Wilfred Lacy L on: 12/08/2018 04:19 PM   Modules accepted: Orders

## 2018-12-08 NOTE — Patient Instructions (Addendum)
Go to lab for blood draw.  Use buspar as needed for panic attacks.  Continue Wellbutrin.  Start heart healthy diet and daily exercise.  You will be contacted to schedule appt with GI   Health Maintenance, Female Adopting a healthy lifestyle and getting preventive care are important in promoting health and wellness. Ask your health care provider about:  The right schedule for you to have regular tests and exams.  Things you can do on your own to prevent diseases and keep yourself healthy. What should I know about diet, weight, and exercise? Eat a healthy diet   Eat a diet that includes plenty of vegetables, fruits, low-fat dairy products, and lean protein.  Do not eat a lot of foods that are high in solid fats, added sugars, or sodium. Maintain a healthy weight Body mass index (BMI) is used to identify weight problems. It estimates body fat based on height and weight. Your health care provider can help determine your BMI and help you achieve or maintain a healthy weight. Get regular exercise Get regular exercise. This is one of the most important things you can do for your health. Most adults should:  Exercise for at least 150 minutes each week. The exercise should increase your heart rate and make you sweat (moderate-intensity exercise).  Do strengthening exercises at least twice a week. This is in addition to the moderate-intensity exercise.  Spend less time sitting. Even light physical activity can be beneficial. Watch cholesterol and blood lipids Have your blood tested for lipids and cholesterol at 51 years of age, then have this test every 5 years. Have your cholesterol levels checked more often if:  Your lipid or cholesterol levels are high.  You are older than 51 years of age.  You are at high risk for heart disease. What should I know about cancer screening? Depending on your health history and family history, you may need to have cancer screening at various ages.  This may include screening for:  Breast cancer.  Cervical cancer.  Colorectal cancer.  Skin cancer.  Lung cancer. What should I know about heart disease, diabetes, and high blood pressure? Blood pressure and heart disease  High blood pressure causes heart disease and increases the risk of stroke. This is more likely to develop in people who have high blood pressure readings, are of African descent, or are overweight.  Have your blood pressure checked: ? Every 3-5 years if you are 75-9 years of age. ? Every year if you are 38 years old or older. Diabetes Have regular diabetes screenings. This checks your fasting blood sugar level. Have the screening done:  Once every three years after age 59 if you are at a normal weight and have a low risk for diabetes.  More often and at a younger age if you are overweight or have a high risk for diabetes. What should I know about preventing infection? Hepatitis B If you have a higher risk for hepatitis B, you should be screened for this virus. Talk with your health care provider to find out if you are at risk for hepatitis B infection. Hepatitis C Testing is recommended for:  Everyone born from [birth year] through 1963.  Anyone with known risk factors for hepatitis C. Sexually transmitted infections (STIs)  Get screened for STIs, including gonorrhea and chlamydia, if: ? You are sexually active and are younger than 51 years of age. ? You are older than 51 years of age and your health care provider tells you  that you are at risk for this type of infection. ? Your sexual activity has changed since you were last screened, and you are at increased risk for chlamydia or gonorrhea. Ask your health care provider if you are at risk.  Ask your health care provider about whether you are at high risk for HIV. Your health care provider may recommend a prescription medicine to help prevent HIV infection. If you choose to take medicine to prevent HIV, you  should first get tested for HIV. You should then be tested every 3 months for as long as you are taking the medicine. Pregnancy  If you are about to stop having your period (premenopausal) and you may become pregnant, seek counseling before you get pregnant.  Take 400 to 800 micrograms (mcg) of folic acid every day if you become pregnant.  Ask for birth control (contraception) if you want to prevent pregnancy. Osteoporosis and menopause Osteoporosis is a disease in which the bones lose minerals and strength with aging. This can result in bone fractures. If you are 30 years old or older, or if you are at risk for osteoporosis and fractures, ask your health care provider if you should:  Be screened for bone loss.  Take a calcium or vitamin D supplement to lower your risk of fractures.  Be given hormone replacement therapy (HRT) to treat symptoms of menopause. Follow these instructions at home: Lifestyle  Do not use any products that contain nicotine or tobacco, such as cigarettes, e-cigarettes, and chewing tobacco. If you need help quitting, ask your health care provider.  Do not use street drugs.  Do not share needles.  Ask your health care provider for help if you need support or information about quitting drugs. Alcohol use  Do not drink alcohol if: ? Your health care provider tells you not to drink. ? You are pregnant, may be pregnant, or are planning to become pregnant.  If you drink alcohol: ? Limit how much you use to 0-1 drink a day. ? Limit intake if you are breastfeeding.  Be aware of how much alcohol is in your drink. In the [place] one drink equals one 12 oz bottle of beer (355 mL), one 5 oz glass of wine (148 mL), or one 1 oz glass of hard liquor (44 mL). General instructions  Schedule regular health, dental, and eye exams.  Stay current with your vaccines.  Tell your health care provider if: ? You often feel depressed. ? You have ever been abused or do not feel  safe at home. Summary  Adopting a healthy lifestyle and getting preventive care are important in promoting health and wellness.  Follow your health care provider's instructions about healthy diet, exercising, and getting tested or screened for diseases.  Follow your health care provider's instructions on monitoring your cholesterol and blood pressure. This information is not intended to replace advice given to you by your health care provider. Make sure you discuss any questions you have with your health care provider. Document Released: 08/26/2010 Document Revised: 02/03/2018 Document Reviewed: 02/03/2018 Elsevier Patient Education  2020 [employer].

## 2018-12-08 NOTE — Assessment & Plan Note (Addendum)
Chronic, waxing and waning Use of wellbutrin and lexapro in past. Weight gain with lexapro, no adverse effects with wellbutrin Resumed wellbutrin 100mg  daily 46month ago, no improvement in mood Social use of ETOH, no exercise, no particular diet. Use of marijuana once a week, no tobacco use. spents time with family and friend, when not working. Works from home, lives with adult children (adopted).  She reports improved mood with wellbutrin XL 150mg  daily F/up in 83months

## 2018-12-09 ENCOUNTER — Encounter: Payer: Self-pay | Admitting: Nurse Practitioner

## 2018-12-14 ENCOUNTER — Encounter: Payer: Self-pay | Admitting: Gastroenterology

## 2018-12-23 ENCOUNTER — Other Ambulatory Visit: Payer: Self-pay

## 2018-12-24 ENCOUNTER — Other Ambulatory Visit (INDEPENDENT_AMBULATORY_CARE_PROVIDER_SITE_OTHER): Payer: BC Managed Care – PPO

## 2018-12-24 DIAGNOSIS — D649 Anemia, unspecified: Secondary | ICD-10-CM | POA: Diagnosis not present

## 2018-12-24 LAB — CBC WITH DIFFERENTIAL/PLATELET
Basophils Absolute: 0 10*3/uL (ref 0.0–0.1)
Basophils Relative: 0.9 % (ref 0.0–3.0)
Eosinophils Absolute: 0.1 10*3/uL (ref 0.0–0.7)
Eosinophils Relative: 2.8 % (ref 0.0–5.0)
HCT: 33.7 % — ABNORMAL LOW (ref 36.0–46.0)
Hemoglobin: 11.2 g/dL — ABNORMAL LOW (ref 12.0–15.0)
Lymphocytes Relative: 46.8 % — ABNORMAL HIGH (ref 12.0–46.0)
Lymphs Abs: 1.7 10*3/uL (ref 0.7–4.0)
MCHC: 33.1 g/dL (ref 30.0–36.0)
MCV: 86.6 fl (ref 78.0–100.0)
Monocytes Absolute: 0.3 10*3/uL (ref 0.1–1.0)
Monocytes Relative: 7.5 % (ref 3.0–12.0)
Neutro Abs: 1.5 10*3/uL (ref 1.4–7.7)
Neutrophils Relative %: 42 % — ABNORMAL LOW (ref 43.0–77.0)
Platelets: 271 10*3/uL (ref 150.0–400.0)
RBC: 3.89 Mil/uL (ref 3.87–5.11)
RDW: 18.2 % — ABNORMAL HIGH (ref 11.5–15.5)
WBC: 3.7 10*3/uL — ABNORMAL LOW (ref 4.0–10.5)

## 2018-12-24 LAB — IBC PANEL
Iron: 37 ug/dL — ABNORMAL LOW (ref 42–145)
Saturation Ratios: 9 % — ABNORMAL LOW (ref 20.0–50.0)
Transferrin: 295 mg/dL (ref 212.0–360.0)

## 2019-01-04 ENCOUNTER — Other Ambulatory Visit: Payer: Self-pay

## 2019-01-04 ENCOUNTER — Encounter: Payer: Self-pay | Admitting: Gastroenterology

## 2019-01-04 ENCOUNTER — Ambulatory Visit (AMBULATORY_SURGERY_CENTER): Payer: BC Managed Care – PPO | Admitting: *Deleted

## 2019-01-04 VITALS — Temp 96.8°F | Ht 64.0 in | Wt 175.6 lb

## 2019-01-04 DIAGNOSIS — Z1159 Encounter for screening for other viral diseases: Secondary | ICD-10-CM

## 2019-01-04 DIAGNOSIS — Z1211 Encounter for screening for malignant neoplasm of colon: Secondary | ICD-10-CM

## 2019-01-04 MED ORDER — SUPREP BOWEL PREP KIT 17.5-3.13-1.6 GM/177ML PO SOLN: 1.0000 | Freq: Once | ORAL | 0 refills | Status: AC

## 2019-01-04 NOTE — Progress Notes (Signed)
No egg or soy allergy known to patient  No issues with past sedation with any surgeries  or procedures, no past  intubation  No diet pills per patient No home 02 use per patient  No blood thinners per patient  Pt denies issues with constipation  No A fib or A flutter  EMMI video sent to pt's e mail   COV test 11-20 Friday 10 am   Due to the COVID-19 pandemic we are asking patients to follow these guidelines. Please only bring one care partner. Please be aware that your care partner may wait in the car in the parking lot or if they feel like they will be too hot to wait in the car, they may wait in the lobby on the 4th floor. All care partners are required to wear a mask the entire time (we do not have any that we can provide them), they need to practice social distancing, and we will do a Covid check for all patient's and care partners when you arrive. Also we will check their temperature and your temperature. If the care partner waits in their car they need to stay in the parking lot the entire time and we will call them on their cell phone when the patient is ready for discharge so they can bring the car to the front of the building. Also all patient's will need to wear a mask into building.  [employer] Coupon $15 to pt

## 2019-01-14 ENCOUNTER — Other Ambulatory Visit: Payer: Self-pay | Admitting: Gastroenterology

## 2019-01-14 ENCOUNTER — Ambulatory Visit: Payer: BC Managed Care – PPO

## 2019-01-14 DIAGNOSIS — Z1159 Encounter for screening for other viral diseases: Secondary | ICD-10-CM

## 2019-01-17 LAB — SARS CORONAVIRUS 2 (TAT 6-24 HRS): SARS Coronavirus 2: NEGATIVE

## 2019-01-18 ENCOUNTER — Ambulatory Visit (AMBULATORY_SURGERY_CENTER): Payer: BC Managed Care – PPO | Admitting: Gastroenterology

## 2019-01-18 ENCOUNTER — Encounter: Payer: Self-pay | Admitting: Gastroenterology

## 2019-01-18 ENCOUNTER — Other Ambulatory Visit: Payer: Self-pay

## 2019-01-18 VITALS — BP 129/78 | HR 68 | Temp 98.7°F | Resp 11 | Ht 64.0 in | Wt 175.6 lb

## 2019-01-18 DIAGNOSIS — K635 Polyp of colon: Secondary | ICD-10-CM | POA: Diagnosis not present

## 2019-01-18 DIAGNOSIS — Z1211 Encounter for screening for malignant neoplasm of colon: Secondary | ICD-10-CM | POA: Diagnosis present

## 2019-01-18 DIAGNOSIS — D122 Benign neoplasm of ascending colon: Secondary | ICD-10-CM

## 2019-01-18 DIAGNOSIS — K641 Second degree hemorrhoids: Secondary | ICD-10-CM

## 2019-01-18 MED ORDER — SODIUM CHLORIDE 0.9 % IV SOLN: 500.0000 mL | Freq: Once | INTRAVENOUS | Status: DC

## 2019-01-18 NOTE — Patient Instructions (Signed)
Handouts include: Polyps, Hemorrhoids, Hemorrhoidal banding and High Fiber Diet.  Use Fiber, for example Citrucel,Fibercon,Konsyl or Metamucil.  If you are interested in Hemorrhoidal banding contact the office, [phone].    YOU HAD AN ENDOSCOPIC PROCEDURE TODAY AT [facility]:   Refer to the procedure report that was given to you for any specific questions about what was found during the examination.  If the procedure report does not answer your questions, please call your gastroenterologist to clarify.  If you requested that your care partner not be given the details of your procedure findings, then the procedure report has been included in a sealed envelope for you to review at your convenience later.  YOU SHOULD EXPECT: Some feelings of bloating in the abdomen. Passage of more gas than usual.  Walking can help get rid of the air that was put into your GI tract during the procedure and reduce the bloating. If you had a lower endoscopy (such as a colonoscopy or flexible sigmoidoscopy) you may notice spotting of blood in your stool or on the toilet paper. If you underwent a bowel prep for your procedure, you may not have a normal bowel movement for a few days.  Please Note:  You might notice some irritation and congestion in your nose or some drainage.  This is from the oxygen used during your procedure.  There is no need for concern and it should clear up in a day or so.  SYMPTOMS TO REPORT IMMEDIATELY:   Following lower endoscopy (colonoscopy or flexible sigmoidoscopy):  Excessive amounts of blood in the stool  Significant tenderness or worsening of abdominal pains  Swelling of the abdomen that is new, acute  Fever of 100F or higher   For urgent or emergent issues, a gastroenterologist can be reached at any hour by calling [phone].   DIET:  We do recommend a small meal at first, but then you may proceed to your regular diet.  Drink plenty of fluids but you  should avoid alcoholic beverages for 24 hours.  ACTIVITY:  You should plan to take it easy for the rest of today and you should NOT DRIVE or use heavy machinery until tomorrow (because of the sedation medicines used during the test).    FOLLOW UP: Our staff will call the number listed on your records 48-72 hours following your procedure to check on you and address any questions or concerns that you may have regarding the information given to you following your procedure. If we do not reach you, we will leave a message.  We will attempt to reach you two times.  During this call, we will ask if you have developed any symptoms of COVID 19. If you develop any symptoms (ie: fever, flu-like symptoms, shortness of breath, cough etc.) before then, please call [phone].  If you test positive for Covid 19 in the 2 weeks post procedure, please call and report this information to [place].    If any biopsies were taken you will be contacted by phone or by letter within the next 1-3 weeks.  Please call us at [phone] if you have not heard about the biopsies in 3 weeks.    SIGNATURES/CONFIDENTIALITY: You and/or your care partner have signed paperwork which will be entered into your electronic medical record.  These signatures attest to the fact that that the information above on your After Visit Summary has been reviewed and is understood.  Full responsibility of the confidentiality of this discharge information  lies with you and/or your care-partner.

## 2019-01-18 NOTE — Progress Notes (Signed)
[name] Temperature- Ana Griffin  Pt's states no medical or surgical changes since previsit or office visit.  Brittnay Pigman, Fraser Din

## 2019-01-18 NOTE — Op Note (Signed)
[place] Patient Name: Ana Griffin Procedure Date: 01/18/2019 8:49 AM MRN: [id number] Endoscopist: Gerrit Heck , MD Age: 53 Referring MD:  Date of Birth: [birth year] Gender: Female Account #: [id number] Procedure:                Colonoscopy Indications:              Screening for colorectal malignant neoplasm, This                            is the patient's first colonoscopy Medicines:                Monitored Anesthesia Care Procedure:                Pre-Anesthesia Assessment:                           - Prior to the procedure, a History and Physical                            was performed, and patient medications and                            allergies were reviewed. The patient's tolerance of                            previous anesthesia was also reviewed. The risks                            and benefits of the procedure and the sedation                            options and risks were discussed with the patient.                            All questions were answered, and informed consent                            was obtained. Prior Anticoagulants: The patient has                            taken no previous anticoagulant or antiplatelet                            agents. ASA Grade Assessment: II - A patient with                            mild systemic disease. After reviewing the risks                            and benefits, the patient was deemed in                            satisfactory condition to undergo the procedure.  After obtaining informed consent, the colonoscope                            was passed under direct vision. Throughout the                            procedure, the patient's blood pressure, pulse, and                            oxygen saturations were monitored continuously. The                            Colonoscope was introduced through the anus and                            advanced to the the  cecum, identified by                            appendiceal orifice and ileocecal valve. The                            colonoscopy was performed without difficulty. The                            patient tolerated the procedure well. The quality                            of the bowel preparation was excellent. The                            ileocecal valve, appendiceal orifice, and rectum                            were photographed. Scope In: 9:19:15 AM Scope Out: 9:40:47 AM Scope Withdrawal Time: 0 hours 14 minutes 57 seconds  Total Procedure Duration: 0 hours 21 minutes 32 seconds  Findings:                 Hemorrhoids were found on perianal exam.                           A 18 mm polyp was found in the ascending colon. The                            polyp was sessile. The polyp was removed with a                            cold snare. Resection and retrieval were complete.                            Estimated blood loss was minimal.                           Non-bleeding internal hemorrhoids were found during  retroflexion. The hemorrhoids were small.                           The exam was otherwise normal throughout the                            remainder of the colon. Complications:            No immediate complications. Estimated Blood Loss:     Estimated blood loss was minimal. Impression:               - Hemorrhoids found on perianal exam.                           - One 18 mm polyp in the ascending colon, removed                            with a cold snare. Resected and retrieved.                           - Non-bleeding internal hemorrhoids. Recommendation:           - Patient has a contact number available for                            emergencies. The signs and symptoms of potential                            delayed complications were discussed with the                            patient. Return to normal activities tomorrow.                             Written discharge instructions were provided to the                            patient.                           - Resume previous diet.                           - Continue present medications.                           - Await pathology results.                           - Pending pathology results, recommend repeat                            colonoscopy in 3 years for surveillance.                           - Return to GI office PRN.                           -  Use fiber, for example Citrucel, Fibercon, Konsyl                            or Metamucil.                           - Internal hemorrhoids were noted on this study and                            may be amenable to hemorrhoid band ligation. If you                            are interested in further treatment of these                            hemorrhoids with band ligation, please contact my                            clinic to set up an appointment for evaluation and                            treatment. Gerrit Heck, MD 01/18/2019 9:45:18 AM

## 2019-01-18 NOTE — Progress Notes (Signed)
Called to room to assist during endoscopic procedure.  Patient ID and intended procedure confirmed with present staff. Received instructions for my participation in the procedure from the performing physician.  

## 2019-01-18 NOTE — Progress Notes (Signed)
PT taken to PACU. Monitors in place. VSS. Report given to RN. 

## 2019-01-24 ENCOUNTER — Telehealth: Payer: Self-pay

## 2019-01-24 ENCOUNTER — Encounter: Payer: Self-pay | Admitting: Gastroenterology

## 2019-01-24 NOTE — Telephone Encounter (Signed)
  Follow up Call-  Call back number 01/18/2019  Post procedure Call Back phone  # [phone]  Permission to leave phone message Yes  Some recent data might be hidden     Patient questions:  Do you have a fever, pain , or abdominal swelling? No. Pain Score  0 *  Have you tolerated food without any problems? Yes.    Have you been able to return to your normal activities? Yes.    Do you have any questions about your discharge instructions: Diet   No. Medications  No. Follow up visit  No.  Do you have questions or concerns about your Care? No.  Actions: * If pain score is 4 or above: No action needed, pain <4. 1. Have you developed a fever since your procedure? no  2.   Have you had an respiratory symptoms (SOB or cough) since your procedure? no  3.   Have you tested positive for COVID 19 since your procedure no  4.   Have you had any family members/close contacts diagnosed with the COVID 19 since your procedure?  no   If yes to any of these questions please route to Joylene John, RN and Alphonsa Gin, Therapist, sports.

## 2019-01-24 NOTE — Telephone Encounter (Signed)
Attempted to reach patient for post-procedure f/u call. No answer. Left message that we will make another attempt to reach her again later today and for her to please not hesitate to call us if she has any questions/concerns regarding her care. 

## 2019-02-15 ENCOUNTER — Encounter: Payer: Self-pay | Admitting: Nurse Practitioner

## 2019-02-15 ENCOUNTER — Other Ambulatory Visit: Payer: Self-pay

## 2019-02-15 ENCOUNTER — Telehealth (INDEPENDENT_AMBULATORY_CARE_PROVIDER_SITE_OTHER): Payer: BC Managed Care – PPO | Admitting: Nurse Practitioner

## 2019-02-15 VITALS — Temp 97.2°F | Ht 64.0 in | Wt 171.0 lb

## 2019-02-15 DIAGNOSIS — E782 Mixed hyperlipidemia: Secondary | ICD-10-CM

## 2019-02-15 DIAGNOSIS — F3341 Major depressive disorder, recurrent, in partial remission: Secondary | ICD-10-CM

## 2019-02-15 DIAGNOSIS — R350 Frequency of micturition: Secondary | ICD-10-CM | POA: Diagnosis not present

## 2019-02-15 DIAGNOSIS — D508 Other iron deficiency anemias: Secondary | ICD-10-CM

## 2019-02-15 MED ORDER — NITROFURANTOIN MONOHYD MACRO 100 MG PO CAPS: 100.0000 mg | ORAL_CAPSULE | Freq: Two times a day (BID) | ORAL | 0 refills | Status: AC

## 2019-02-15 NOTE — Patient Instructions (Addendum)
Schedule lab appt if COVID test is negative. Maintain today's appt for COVID test.  Maintain adequate oral hydration and start oral abx (take with food). Call office if no improvement in 1week.  Call for another psychologist referral if unable to schedule appt with current therapist.

## 2019-02-15 NOTE — Progress Notes (Signed)
Virtual Visit via Video Note  I connected with Ana Griffin on 02/15/19 at 10:45 AM EST by a video enabled telemedicine application and verified that I am speaking with the correct person using two identifiers.  Location: Patient: home Provider: office Participants: patient and provider I discussed the limitations of evaluation and management by telemedicine and the availability of in person appointments. The patient expressed understanding and agreed to proceed.  CC: c/o urine frequency and odor with urinating x 1 week/no otc/Pt just come back from [place] and would like covid test no symptoms at this time, pt not sure if you would like for her to come in for blood work for iron check since she have had colonoscopy (fasting this morning)  History of Present Illness: Urinary Tract Infection  This is a new problem. The current episode started in the past 7 days. The problem occurs every urination. The problem has been unchanged. The quality of the pain is described as burning. There has been no fever. She is sexually active. There is no history of pyelonephritis. Associated symptoms include frequency and urgency. Pertinent negatives include no chills, discharge, flank pain, hematuria, nausea, possible pregnancy, sweats or vomiting. She has tried nothing for the symptoms. Her past medical history is significant for urinary stasis. There is no history of catheterization, kidney stones, recurrent UTIs, a single kidney or a urological procedure.   She returned from [place] 02/14/2019, while is [place] she visited her sister in hospital and nursing home. She denies any upper respiratory symptoms at this time. She has scheduled an appt with Kristopher Oppenheim for COVID test.  Anemia secondary to iron deficiency: Colonoscopy: benign pre-cancerous polyp removed. Repeat in 37yrs recommended. No melena or hematochezia, no nausea or ABD pain, no fatigue or dizziness. Current use of iron 65mg . Needs repeat  cbc and iron panel.  Reviewed medication and problem list. Also reviewed colonoscopy and pathology report.  Observations/Objective: Physical Exam  Constitutional: She is oriented to person, place, and time.  Pulmonary/Chest: Effort normal.  Neurological: She is alert and oriented to person, place, and time.  Psychiatric: Her speech is normal. Judgment normal. Her mood appears anxious. Thought content is not delusional. Cognition and memory are normal. She exhibits a depressed mood. She expresses no homicidal and no suicidal ideation. She expresses no suicidal plans and no homicidal plans.  Tearful during video call when talking about her sister.   Assessment and Plan: Ana Griffin was seen today for uri.  Diagnoses and all orders for this visit:  Urinary frequency -     nitrofurantoin, macrocrystal-monohydrate, (MACROBID) 100 MG capsule; Take 1 capsule (100 mg total) by mouth 2 (two) times daily for 5 days.  Mixed hyperlipidemia -     HTN_4 Lipid panel; Future  Iron deficiency anemia secondary to inadequate dietary iron intake -     Iron, TIBC and Ferritin Panel; Future -     CBC; Future   Follow Up Instructions: See avs   I discussed the assessment and treatment plan with the patient. The patient was provided an opportunity to ask questions and all were answered. The patient agreed with the plan and demonstrated an understanding of the instructions.   The patient was advised to call back or seek an in-person evaluation if the symptoms worsen or if the condition fails to improve as anticipated.  Wilfred Lacy, NP
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
## 2019-02-16 ENCOUNTER — Ambulatory Visit: Payer: BC Managed Care – PPO | Admitting: Nurse Practitioner

## 2019-02-17 ENCOUNTER — Encounter: Payer: Self-pay | Admitting: Nurse Practitioner

## 2019-05-06 ENCOUNTER — Telehealth: Payer: Self-pay | Admitting: Nurse Practitioner

## 2019-05-06 DIAGNOSIS — F3341 Major depressive disorder, recurrent, in partial remission: Secondary | ICD-10-CM

## 2019-05-06 MED ORDER — BUPROPION HCL ER (XL) 150 MG PO TB24: 150.0000 mg | ORAL_TABLET | Freq: Every day | ORAL | 0 refills | Status: DC

## 2019-05-06 NOTE — Telephone Encounter (Signed)
Patient is calling and requesting to speak to nurse about Wellbutrin. Patient is requesting a RX refill due to patient currently being in [facility] for a family emergency. Please call patient.

## 2019-05-06 NOTE — Telephone Encounter (Signed)
Pt stated she forgot Wellbutrin at home but she is down in [facility] for her family emergency. Sent in 2 weeks supply to [facility]. Pt is aware.

## 2019-05-17 ENCOUNTER — Encounter: Payer: Self-pay | Admitting: Nurse Practitioner

## 2019-05-19 ENCOUNTER — Other Ambulatory Visit: Payer: Self-pay | Admitting: Nurse Practitioner

## 2019-05-19 DIAGNOSIS — Z1231 Encounter for screening mammogram for malignant neoplasm of breast: Secondary | ICD-10-CM

## 2019-05-24 ENCOUNTER — Ambulatory Visit
Admission: RE | Admit: 2019-05-24 | Discharge: 2019-05-24 | Disposition: A | Discharge status: Home / Self Care | Payer: BC Managed Care – PPO | Source: Ambulatory Visit

## 2019-05-24 ENCOUNTER — Other Ambulatory Visit: Payer: Self-pay

## 2019-05-24 DIAGNOSIS — Z1231 Encounter for screening mammogram for malignant neoplasm of breast: Secondary | ICD-10-CM

## 2019-06-27 ENCOUNTER — Ambulatory Visit: Payer: Self-pay | Admitting: Ophthalmology

## 2019-07-08 ENCOUNTER — Other Ambulatory Visit: Payer: Self-pay

## 2019-07-08 ENCOUNTER — Telehealth: Payer: Self-pay

## 2019-07-08 ENCOUNTER — Encounter (HOSPITAL_BASED_OUTPATIENT_CLINIC_OR_DEPARTMENT_OTHER): Payer: Self-pay | Admitting: Ophthalmology

## 2019-07-08 DIAGNOSIS — F3341 Major depressive disorder, recurrent, in partial remission: Secondary | ICD-10-CM

## 2019-07-08 MED ORDER — BUPROPION HCL ER (XL) 150 MG PO TB24: 150.0000 mg | ORAL_TABLET | Freq: Every day | ORAL | 0 refills | Status: DC

## 2019-07-08 NOTE — Telephone Encounter (Signed)
RX sent and patient is aware.  Pt also made an appointment to come in. She is done with quarantine and negative results.

## 2019-07-08 NOTE — Telephone Encounter (Signed)
Baldo Ash do you want me to send 14 tab no refills or 30?

## 2019-07-08 NOTE — Telephone Encounter (Signed)
Ok to send She need OV when she returns and completes her quarantine period and/or has negative COVID results.

## 2019-07-08 NOTE — Telephone Encounter (Signed)
[place] please advise.  Rx request:  Bupropion XL 150mg    14 tab (short supply pt had family emergency with sister in [place])  Last filled-05/06/19 Last office visit-02/15/19  Schedule OV?

## 2019-07-08 NOTE — Telephone Encounter (Signed)
14

## 2019-07-08 NOTE — Addendum Note (Signed)
Addended by: Lucila Maine on: 07/08/2019 01:59 PM   Modules accepted: Orders

## 2019-07-11 ENCOUNTER — Other Ambulatory Visit: Payer: Self-pay

## 2019-07-12 ENCOUNTER — Ambulatory Visit: Payer: BC Managed Care – PPO | Admitting: Nurse Practitioner

## 2019-07-12 ENCOUNTER — Other Ambulatory Visit (HOSPITAL_COMMUNITY)
Admission: RE | Admit: 2019-07-12 | Discharge: 2019-07-12 | Disposition: A | Discharge status: Home / Self Care | Payer: BC Managed Care – PPO | Source: Ambulatory Visit | Attending: Ophthalmology | Admitting: Ophthalmology

## 2019-07-12 ENCOUNTER — Encounter: Payer: Self-pay | Admitting: Nurse Practitioner

## 2019-07-12 VITALS — BP 118/72 | HR 76 | Temp 97.2°F | Ht 64.0 in | Wt 175.0 lb

## 2019-07-12 DIAGNOSIS — F3341 Major depressive disorder, recurrent, in partial remission: Secondary | ICD-10-CM

## 2019-07-12 DIAGNOSIS — E782 Mixed hyperlipidemia: Secondary | ICD-10-CM | POA: Diagnosis not present

## 2019-07-12 DIAGNOSIS — F331 Major depressive disorder, recurrent, moderate: Secondary | ICD-10-CM | POA: Diagnosis not present

## 2019-07-12 DIAGNOSIS — D508 Other iron deficiency anemias: Secondary | ICD-10-CM

## 2019-07-12 DIAGNOSIS — Z01812 Encounter for preprocedural laboratory examination: Secondary | ICD-10-CM | POA: Diagnosis present

## 2019-07-12 DIAGNOSIS — H903 Sensorineural hearing loss, bilateral: Secondary | ICD-10-CM | POA: Diagnosis not present

## 2019-07-12 DIAGNOSIS — Z20822 Contact with and (suspected) exposure to covid-19: Secondary | ICD-10-CM | POA: Diagnosis not present

## 2019-07-12 LAB — CBC WITH DIFFERENTIAL/PLATELET
Basophils Absolute: 0 10*3/uL (ref 0.0–0.1)
Basophils Relative: 0.6 % (ref 0.0–3.0)
Eosinophils Absolute: 0.1 10*3/uL (ref 0.0–0.7)
Eosinophils Relative: 2.8 % (ref 0.0–5.0)
HCT: 39.9 % (ref 36.0–46.0)
Hemoglobin: 13.9 g/dL (ref 12.0–15.0)
Lymphocytes Relative: 32.1 % (ref 12.0–46.0)
Lymphs Abs: 1.5 10*3/uL (ref 0.7–4.0)
MCHC: 35 g/dL (ref 30.0–36.0)
MCV: 90.5 fl (ref 78.0–100.0)
Monocytes Absolute: 0.4 10*3/uL (ref 0.1–1.0)
Monocytes Relative: 7.7 % (ref 3.0–12.0)
Neutro Abs: 2.7 10*3/uL (ref 1.4–7.7)
Neutrophils Relative %: 56.8 % (ref 43.0–77.0)
Platelets: 257 10*3/uL (ref 150.0–400.0)
RBC: 4.41 Mil/uL (ref 3.87–5.11)
RDW: 12 % (ref 11.5–15.5)
WBC: 4.7 10*3/uL (ref 4.0–10.5)

## 2019-07-12 LAB — BASIC METABOLIC PANEL
BUN: 14 mg/dL (ref 6–23)
CO2: 31 mEq/L (ref 19–32)
Calcium: 9.6 mg/dL (ref 8.4–10.5)
Chloride: 100 mEq/L (ref 96–112)
Creatinine, Ser: 1.03 mg/dL (ref 0.40–1.20)
GFR: 56.27 mL/min — ABNORMAL LOW (ref >60.00)
Glucose, Bld: 101 mg/dL — ABNORMAL HIGH (ref 70–99)
Potassium: 4.2 mEq/L (ref 3.5–5.1)
Sodium: 137 mEq/L (ref 135–145)

## 2019-07-12 LAB — LIPID PANEL
Cholesterol: 277 mg/dL — ABNORMAL HIGH (ref 0–200)
HDL: 47 mg/dL (ref >39.00)
NonHDL: 229.81
Total CHOL/HDL Ratio: 6
Triglycerides: 252 mg/dL — ABNORMAL HIGH (ref 0.0–149.0)
VLDL: 50.4 mg/dL — ABNORMAL HIGH (ref 0.0–40.0)

## 2019-07-12 LAB — LDL CHOLESTEROL, DIRECT: Direct LDL: 173 mg/dL

## 2019-07-12 LAB — SARS CORONAVIRUS 2 (TAT 6-24 HRS): SARS Coronavirus 2: NEGATIVE

## 2019-07-12 MED ORDER — QUETIAPINE FUMARATE 25 MG PO TABS: 25.0000 mg | ORAL_TABLET | Freq: Every day | ORAL | 2 refills | Status: DC

## 2019-07-12 MED ORDER — FENOFIBRATE 54 MG PO TABS: 54.0000 mg | ORAL_TABLET | Freq: Every day | ORAL | 1 refills | Status: DC

## 2019-07-12 NOTE — Assessment & Plan Note (Signed)
Worsening mood, due to sister's poor prognosis, taking care of nephews and brother. Current use of wellbutrin and weekly CBT sessions. Unable to tolerate alprazolam due to increase sedation. No improvement with effexor and buspar in past. lexapro discontinued in past due to weight gain.  Maintain wellbutrin Start seroquel at hs F/up in 81month

## 2019-07-12 NOTE — Assessment & Plan Note (Signed)
Noted several months ago by friend. She is unsure of onset because she works from home with headset, and has limited public interaction. Denies any head injury or tinnitus or ear pain or headache or dizziness/vertigo. Last TSh check 11/2018: normal

## 2019-07-12 NOTE — Progress Notes (Signed)
Subjective:  Patient ID: Ana Griffin, female    DOB: [birth year]  Age: 54 y.o. MRN: [id number]  CC: Follow-up (med refills-wellbutrin//pt wants to ask about additional meds to help)  HPI Anxiety and Depression: Worsening mood, due to sister's poor prognosis, taking care of nephews and brother. Current use of wellbutrin and weekly CBT sessions. Unable to tolerate alprazolam due to increase sedation. Depression screen [facility] 2/9 07/12/2019 07/12/2019 12/08/2018  Decreased Interest 3 0 0  Down, Depressed, Hopeless 3 0 0  PHQ - 2 Score 6 0 0  Altered sleeping 3 - -  Tired, decreased energy 2 - -  Change in appetite 2 - -  Feeling bad or failure about yourself  3 - -  Trouble concentrating 1 - -  Moving slowly or fidgety/restless 0 - -  Suicidal thoughts 1 - -  PHQ-9 Score 18 - -  Difficult doing work/chores - - -  Some recent data might be hidden   GAD 7 : Generalized Anxiety Score 07/12/2019 07/07/2018 04/14/2018 05/15/2017  Nervous, Anxious, on Edge 2 0 0 2  Control/stop worrying 1 1 0 2  Worry too much - different things 1 0 0 2  Trouble relaxing 3 0 1 3  Restless 1 0 1 1  Easily annoyed or irritable 3 1 1 1   Afraid - awful might happen 0 0 0 1  Total GAD 7 Score 11 2 3 12   Anxiety Difficulty - - - Somewhat difficult   Hearing loss: Noted several months ago by friend. Denies any head injury or tinnitus or ear pain or headache or dizziness/vertigo. Last TSh check 11/2018: normal  Reviewed past Medical, Social and Family history today.  Outpatient Medications Prior to Visit  Medication Sig Dispense Refill  . Ascorbic Acid (VITAMIN C) 1000 MG tablet Take 1,000 mg by mouth daily.    Marland Kitchen aspirin 81 MG tablet Take 81 mg by mouth daily.    Marland Kitchen b complex vitamins tablet Take 1 tablet by mouth daily.    . Bacillus Coagulans-Inulin (ALIGN PREBIOTIC-PROBIOTIC) 5-1.25 MG-GM CHEW Chew by mouth.    Marland Kitchen BIOTIN PO Take 1 tablet by mouth daily. Reported on 08/04/2015    . buPROPion (WELLBUTRIN  XL) 150 MG 24 hr tablet Take 1 tablet (150 mg total) by mouth daily. 14 tablet 0  . Calcium-Magnesium-Vitamin D (CALCIUM MAGNESIUM PO) Take 333 mg by mouth.    . Cholecalciferol (VITAMIN D3) 50 MCG (2000 UT) TABS Take by mouth.    . Ferrous Sulfate (IRON PO) Take 65 mg by mouth.     . fish oil-omega-3 fatty acids 1000 MG capsule Take 2 g by mouth daily.    Nyoka Cowden Tea, Camellia sinensis, (GREEN TEA PO) Take 400 mg by mouth.    Marland Kitchen OVER THE COUNTER MEDICATION Thyroid Support Thomas Hoff) Stress support    . OVER THE COUNTER MEDICATION 553 mg. Tumeric    . Probiotic Product (PROBIOTIC DAILY PO) Take by mouth.     No facility-administered medications prior to visit.    ROS See HPI  Objective:  BP 118/72   Pulse 76   Temp (!) 97.2 F (36.2 C) (Tympanic)   Ht 5\' 4"  (1.626 m)   Wt 175 lb (79.4 kg)   SpO2 96%   BMI 30.04 kg/m   BP Readings from Last 3 Encounters:  07/12/19 118/72  01/18/19 129/78  12/08/18 100/70    Wt Readings from Last 3 Encounters:  07/12/19 175 lb (79.4 kg)  02/15/19  171 lb (77.6 kg)  01/18/19 175 lb 9.6 oz (79.7 kg)    Physical Exam Vitals reviewed.  Constitutional:      Appearance: She is obese.  HENT:     Right Ear: Tympanic membrane, ear canal and external ear normal. Decreased hearing noted.     Left Ear: Tympanic membrane, ear canal and external ear normal. Decreased hearing noted.     Ears:     Weber exam findings: does not lateralize.    Right Rinne: AC > BC.    Left Rinne: AC > BC. Cardiovascular:     Rate and Rhythm: Normal rate and regular rhythm.     Pulses: Normal pulses.     Heart sounds: Normal heart sounds.  Pulmonary:     Effort: Pulmonary effort is normal.  Neurological:     Mental Status: She is alert and oriented to person, place, and time.     Lab Results  Component Value Date   WBC 4.7 07/12/2019   HGB 13.9 07/12/2019   HCT 39.9 07/12/2019   PLT 257.0 07/12/2019   GLUCOSE 101 (H) 07/12/2019   CHOL 277 (H) 07/12/2019    TRIG 252.0 (H) 07/12/2019   HDL 47.00 07/12/2019   LDLDIRECT 173.0 07/12/2019   LDLCALC 135 (H) 12/08/2018   ALT 11 12/08/2018   AST 20 12/08/2018   NA 137 07/12/2019   K 4.2 07/12/2019   CL 100 07/12/2019   CREATININE 1.03 07/12/2019   BUN 14 07/12/2019   CO2 31 07/12/2019   TSH 2.02 12/08/2018   HGBA1C 5.5 09/08/2014    No results found.  Assessment & Plan:  This visit occurred during the SARS-CoV-2 public health emergency.  Safety protocols were in place, including screening questions prior to the visit, additional usage of staff PPE, and extensive cleaning of exam room while observing appropriate contact time as indicated for disinfecting solutions.   Ana Griffin was seen today for follow-up.  Diagnoses and all orders for this visit:  Moderate episode of recurrent major depressive disorder (HCC) -     QUEtiapine (SEROQUEL) 25 MG tablet; Take 1 tablet (25 mg total) by mouth at bedtime. -     Basic metabolic panel  Iron deficiency anemia secondary to inadequate dietary iron intake -     CBC with Differential/Platelet -     Iron, TIBC and Ferritin Panel  Mixed hyperlipidemia -     Lipid panel -     fenofibrate 54 MG tablet; Take 1 tablet (54 mg total) by mouth daily.  Sensorineural hearing loss (SNHL) of both ears -     Ambulatory referral to Audiology  Other orders -     LDL cholesterol, direct   I am having Ana Griffin start on QUEtiapine and fenofibrate. I am also having her maintain her aspirin, fish oil-omega-3 fatty acids, vitamin C, BIOTIN PO, b complex vitamins, (Green Tea, Camellia sinensis, (GREEN TEA PO)), Calcium-Magnesium-Vitamin D (CALCIUM MAGNESIUM PO), OVER THE COUNTER MEDICATION, Vitamin D3, OVER THE COUNTER MEDICATION, Probiotic Product (PROBIOTIC DAILY PO), Ferrous Sulfate (IRON PO), Align Prebiotic-Probiotic, and buPROPion.  Meds ordered this encounter  Medications  . QUEtiapine (SEROQUEL) 25 MG tablet    Sig: Take 1 tablet (25 mg total) by  mouth at bedtime.    Dispense:  30 tablet    Refill:  2    Order Specific Question:   Supervising Provider    Answer:   Ronnald Nian [id number]  . fenofibrate 54 MG tablet    Sig:  Take 1 tablet (54 mg total) by mouth daily.    Dispense:  90 tablet    Refill:  1    Order Specific Question:   Supervising Provider    Answer:   Ronnald Nian [id number]    Problem List Items Addressed This Visit      Nervous and Auditory   Sensorineural hearing loss (SNHL) of both ears    Noted several months ago by friend. She is unsure of onset because she works from home with headset, and has limited public interaction. Denies any head injury or tinnitus or ear pain or headache or dizziness/vertigo. Last TSh check 11/2018: normal       Relevant Orders   Ambulatory referral to Audiology     Other   Depression - Primary    Worsening mood, due to sister's poor prognosis, taking care of nephews and brother. Current use of wellbutrin and weekly CBT sessions. Unable to tolerate alprazolam due to increase sedation. No improvement with effexor and buspar in past. lexapro discontinued in past due to weight gain.  Maintain wellbutrin Start seroquel at hs F/up in 55month      Relevant Medications   QUEtiapine (SEROQUEL) 25 MG tablet   Other Relevant Orders   Basic metabolic panel (Completed)   Iron deficiency anemia secondary to inadequate dietary iron intake   Relevant Orders   CBC with Differential/Platelet (Completed)   Iron, TIBC and Ferritin Panel   Mixed hyperlipidemia   Relevant Medications   fenofibrate 54 MG tablet   Other Relevant Orders   Lipid panel (Completed)      Follow-up: Return in about 4 weeks (around 08/09/2019) for anxiety and depression (video or F2F).  Wilfred Lacy, NP

## 2019-07-12 NOTE — Patient Instructions (Signed)
Maintain wellbutrin dose Start seroquel at bedtime Consider referral to psychiatry if this does not help Continue sessions with psychologist.  Go to lab for blood draw. You will be contacted to schedule appt with audiology.  Quetiapine tablets What is this medicine? QUETIAPINE (kwe TYE a peen) is an antipsychotic. It is used to treat schizophrenia and bipolar disorder, also known as manic-depression. This medicine may be used for other purposes; ask your health care provider or pharmacist if you have questions. COMMON BRAND NAME(S): Seroquel What should I tell my health care provider before I take this medicine? They need to know if you have any of these conditions:  blockage in your bowel  cataracts  constipation  dehydration  diabetes  difficulty swallowing  glaucoma  heart disease  history of breast cancer  kidney disease  liver disease  low blood counts, like low white cell, platelet, or red cell counts  low blood pressure or dizziness when standing up  Parkinson's disease  previous heart attack  prostate disease  seizures  stomach or intestine problems  suicidal thoughts, plans or attempt; a previous suicide attempt by you or a family member  thyroid disease  trouble passing urine  an unusual or allergic reaction to quetiapine, other medicines, foods, dyes, or preservatives  pregnant or trying to get pregnant  breast-feeding How should I use this medicine? Take this medicine by mouth. Swallow it with a drink of water. Follow the directions on the prescription label. If it upsets your stomach you can take it with food. Take your medicine at regular intervals. Do not take it more often than directed. Do not stop taking except on the advice of your doctor or health care professional. A special MedGuide will be given to you by the pharmacist with each prescription and refill. Be sure to read this information carefully each time. Talk to your  pediatrician regarding the use of this medicine in children. While this drug may be prescribed for children as young as 10 years for selected conditions, precautions do apply. Patients over age 7 years may have a stronger reaction to this medicine and need smaller doses. Overdosage: If you think you have taken too much of this medicine contact a poison control center or emergency room at once. NOTE: This medicine is only for you. Do not share this medicine with others. What if I miss a dose? If you miss a dose, take it as soon as you can. If it is almost time for your next dose, take only that dose. Do not take double or extra doses. What may interact with this medicine? Do not take this medicine with any of the following medications:  cisapride  dronedarone  fluconazole  metoclopramide  pimozide  posaconazole  thioridazine This medicine may also interact with the following medications:  alcohol  antihistamines for allergy cough and cold  antiviral medicines for HIV or AIDS  atropine  certain medicines for bladder problems like oxybutynin, tolterodine  certain medicines for blood pressure  certain medicines for depression, anxiety, or psychotic disturbances  certain medicines for diabetes  certain medicines for stomach problems like dicyclomine, hyoscyamine  certain medicines for travel sickness like scopolamine  certain medicines for Parkinson's disease  certain medicines for seizures like carbamazepine, phenobarbital, phenytoin  cimetidine  erythromycin  ipratropium  other medicines that prolong the QT interval (cause an abnormal heart rhythm) like dofetilide  rifampin  steroid medicines like prednisone or cortisone This list may not describe all possible interactions. Give your  health care provider a list of all the medicines, herbs, non-prescription drugs, or dietary supplements you use. Also tell them if you smoke, drink alcohol, or use illegal drugs.  Some items may interact with your medicine. What should I watch for while using this medicine? Visit your health care professional for regular checks on your progress. Tell your health care professional if symptoms do not start to get better or if they get worse. Do not stop taking except on your health care professional's advice. You may develop a severe reaction. Your health care professional will tell you how much medicine to take. You may need to have an eye exam before and during use of this medicine. This medicine may increase blood sugar. Ask your health care provider if changes in diet or medicines are needed if you have diabetes. Patients and their families should watch out for new or worsening depression or thoughts of suicide. Also watch out for sudden or severe changes in feelings such as feeling anxious, agitated, panicky, irritable, hostile, aggressive, impulsive, severely restless, overly excited and hyperactive, or not being able to sleep. If this happens, especially at the beginning of antidepressant treatment or after a change in dose, call your health care professional. Dennis Bast may get dizzy or drowsy. Do not drive, use machinery, or do anything that needs mental alertness until you know how this medicine affects you. Do not stand or sit up quickly, especially if you are an older patient. This reduces the risk of dizzy or fainting spells. Alcohol may interfere with the effect of this medicine. Avoid alcoholic drinks. This drug can cause problems with controlling your body temperature. It can lower the response of your body to cold temperatures. If possible, stay indoors during cold weather. If you must go outdoors, wear warm clothes. It can also lower the response of your body to heat. Do not overheat. Do not over-exercise. Stay out of the sun when possible. If you must be in the sun, wear cool clothing. Drink plenty of water. If you have trouble controlling your body temperature, call your  health care provider right away. What side effects may I notice from receiving this medicine? Side effects that you should report to your doctor or health care professional as soon as possible:  allergic reactions like skin rash, itching or hives, swelling of the face, lips, or tongue  breathing problems  changes in vision  confusion  elevated mood, decreased need for sleep, racing thoughts, impulsive behavior  eye pain  fast, irregular heartbeat  fever or chills, sore throat  inability to keep still  males: prolonged or painful erection  problems with balance, talking, walking  redness, blistering, peeling, or loosening of the skin, including inside the mouth  seizures  signs and symptoms of high blood sugar such as being more thirsty or hungry or having to urinate more than normal. You may also feel very tired or have blurry vision  signs and symptoms of hypothyroidism like fatigue; increased sensitivity to cold; weight gain; hoarseness; thinning hair  signs and symptoms of low blood pressure like dizziness; feeling faint or lightheaded; falls; unusually weak or tired  signs and symptoms of neuroleptic malignant syndrome like confusion; fast, irregular heartbeat; high fever; increased sweating; stiff muscles  sudden numbness or weakness of the face, arm, or leg  suicidal thoughts or other mood changes  trouble swallowing  uncontrollable movements of the arms, face, head, mouth, neck, or upper body Side effects that usually do not require medical attention (  report to your doctor or health care professional if they continue or are bothersome):  change in sex drive or performance  constipation  drowsiness  dry mouth  upset stomach  weight gain This list may not describe all possible side effects. Call your doctor for medical advice about side effects. You may report side effects to FDA at 1-800-FDA-1088. Where should I keep my medicine? Keep out of the reach  of children. Store at room temperature between 15 and 30 degrees C (59 and 86 degrees F). Throw away any unused medicine after the expiration date. NOTE: This sheet is a summary. It may not cover all possible information. If you have questions about this medicine, talk to your doctor, pharmacist, or health care provider.  2020 Elsevier/Gold Standard (2018-12-08 11:59:11)

## 2019-07-13 LAB — IRON,TIBC AND FERRITIN PANEL
%SAT: 36 % (calc) (ref 16–45)
Ferritin: 61 ng/mL (ref 16–232)
Iron: 113 ug/dL (ref 45–160)
TIBC: 310 mcg/dL (calc) (ref 250–450)

## 2019-07-15 ENCOUNTER — Encounter (HOSPITAL_BASED_OUTPATIENT_CLINIC_OR_DEPARTMENT_OTHER): Payer: Self-pay | Admitting: Ophthalmology

## 2019-07-15 ENCOUNTER — Ambulatory Visit (HOSPITAL_BASED_OUTPATIENT_CLINIC_OR_DEPARTMENT_OTHER)
Admission: RE | Admit: 2019-07-15 | Discharge: 2019-07-15 | Disposition: A | Discharge status: Home / Self Care | Payer: BC Managed Care – PPO | Attending: Ophthalmology | Admitting: Ophthalmology

## 2019-07-15 ENCOUNTER — Ambulatory Visit (HOSPITAL_BASED_OUTPATIENT_CLINIC_OR_DEPARTMENT_OTHER): Payer: BC Managed Care – PPO | Admitting: Anesthesiology

## 2019-07-15 ENCOUNTER — Other Ambulatory Visit: Payer: Self-pay

## 2019-07-15 ENCOUNTER — Ambulatory Visit: Payer: Self-pay | Admitting: Ophthalmology

## 2019-07-15 ENCOUNTER — Encounter (HOSPITAL_BASED_OUTPATIENT_CLINIC_OR_DEPARTMENT_OTHER)
Admission: RE | Disposition: A | Discharge status: Home / Self Care | Payer: Self-pay | Source: Home / Self Care | Attending: Ophthalmology

## 2019-07-15 DIAGNOSIS — Z87891 Personal history of nicotine dependence: Secondary | ICD-10-CM | POA: Insufficient documentation

## 2019-07-15 DIAGNOSIS — H5000 Unspecified esotropia: Secondary | ICD-10-CM | POA: Diagnosis not present

## 2019-07-15 DIAGNOSIS — H5021 Vertical strabismus, right eye: Secondary | ICD-10-CM | POA: Insufficient documentation

## 2019-07-15 HISTORY — PX: STRABISMUS SURGERY: SHX218

## 2019-07-15 SURGERY — REPAIR STRABISMUS
Anesthesia: General | Site: Eye | Laterality: Bilateral

## 2019-07-15 MED ORDER — HYDROMORPHONE HCL 1 MG/ML IJ SOLN
0.2500 mg | INTRAMUSCULAR | Status: DC | PRN
  Administered 2019-07-15: 0.5 mg via INTRAVENOUS

## 2019-07-15 MED ORDER — OXYCODONE HCL 5 MG/5ML PO SOLN: 5.0000 mg | Freq: Once | ORAL | Status: AC | PRN

## 2019-07-15 MED ORDER — DEXAMETHASONE SODIUM PHOSPHATE 10 MG/ML IJ SOLN
INTRAMUSCULAR | Status: AC
  Filled 2019-07-15: qty 1

## 2019-07-15 MED ORDER — TOBRAMYCIN-DEXAMETHASONE 0.3-0.1 % OP OINT
TOPICAL_OINTMENT | OPHTHALMIC | Status: DC | PRN
  Administered 2019-07-15: 1 via OPHTHALMIC

## 2019-07-15 MED ORDER — PROMETHAZINE HCL 25 MG/ML IJ SOLN: 6.2500 mg | INTRAMUSCULAR | Status: DC | PRN

## 2019-07-15 MED ORDER — FENTANYL CITRATE (PF) 100 MCG/2ML IJ SOLN
INTRAMUSCULAR | Status: AC
  Filled 2019-07-15: qty 2

## 2019-07-15 MED ORDER — FENTANYL CITRATE (PF) 100 MCG/2ML IJ SOLN
50.0000 ug | INTRAMUSCULAR | Status: AC | PRN
  Administered 2019-07-15 (×4): 25 ug via INTRAVENOUS

## 2019-07-15 MED ORDER — ONDANSETRON HCL 4 MG/2ML IJ SOLN
INTRAMUSCULAR | Status: DC | PRN
  Administered 2019-07-15: 4 mg via INTRAVENOUS

## 2019-07-15 MED ORDER — PROPOFOL 10 MG/ML IV BOLUS
INTRAVENOUS | Status: DC | PRN
  Administered 2019-07-15: 160 mg via INTRAVENOUS

## 2019-07-15 MED ORDER — MIDAZOLAM HCL 2 MG/2ML IJ SOLN: 1.0000 mg | INTRAMUSCULAR | Status: DC | PRN

## 2019-07-15 MED ORDER — EPHEDRINE SULFATE 50 MG/ML IJ SOLN
INTRAMUSCULAR | Status: DC | PRN
  Administered 2019-07-15: 10 mg via INTRAVENOUS

## 2019-07-15 MED ORDER — MIDAZOLAM HCL 2 MG/2ML IJ SOLN
INTRAMUSCULAR | Status: AC
  Filled 2019-07-15: qty 2

## 2019-07-15 MED ORDER — ONDANSETRON HCL 4 MG/2ML IJ SOLN
INTRAMUSCULAR | Status: AC
  Filled 2019-07-15: qty 2

## 2019-07-15 MED ORDER — LIDOCAINE 2% (20 MG/ML) 5 ML SYRINGE
INTRAMUSCULAR | Status: DC | PRN
  Administered 2019-07-15: 80 mg via INTRAVENOUS

## 2019-07-15 MED ORDER — HYDROMORPHONE HCL 1 MG/ML IJ SOLN
INTRAMUSCULAR | Status: AC
  Filled 2019-07-15: qty 0.5

## 2019-07-15 MED ORDER — ATROPINE SULFATE 0.4 MG/ML IJ SOLN
INTRAMUSCULAR | Status: DC | PRN
  Administered 2019-07-15: .2 mg via INTRAVENOUS

## 2019-07-15 MED ORDER — LACTATED RINGERS IV SOLN: INTRAVENOUS | Status: DC

## 2019-07-15 MED ORDER — DEXAMETHASONE SODIUM PHOSPHATE 10 MG/ML IJ SOLN
INTRAMUSCULAR | Status: DC | PRN
  Administered 2019-07-15: 10 mg via INTRAVENOUS

## 2019-07-15 MED ORDER — OXYCODONE HCL 5 MG PO TABS
ORAL_TABLET | ORAL | Status: AC
  Filled 2019-07-15: qty 1

## 2019-07-15 MED ORDER — OXYCODONE HCL 5 MG PO TABS
5.0000 mg | ORAL_TABLET | Freq: Once | ORAL | Status: AC | PRN
  Administered 2019-07-15: 5 mg via ORAL

## 2019-07-15 MED ORDER — LIDOCAINE 2% (20 MG/ML) 5 ML SYRINGE
INTRAMUSCULAR | Status: AC
  Filled 2019-07-15: qty 5

## 2019-07-15 MED ORDER — MIDAZOLAM HCL 5 MG/5ML IJ SOLN
INTRAMUSCULAR | Status: DC | PRN
  Administered 2019-07-15: 2 mg via INTRAVENOUS

## 2019-07-15 SURGICAL SUPPLY — 35 items
APL SRG 3 HI ABS STRL LF PLS (MISCELLANEOUS) ×1
APL SWBSTK 6 STRL LF DISP (MISCELLANEOUS) ×4
APPLICATOR COTTON TIP 6 STRL (MISCELLANEOUS) ×4 IMPLANT
APPLICATOR COTTON TIP 6IN STRL (MISCELLANEOUS) ×12
APPLICATOR DR MATTHEWS STRL (MISCELLANEOUS) ×3 IMPLANT
BNDG EYE OVAL (GAUZE/BANDAGES/DRESSINGS) IMPLANT
CAUTERY EYE LOW TEMP 1300F FIN (OPHTHALMIC RELATED) IMPLANT
CLOSURE WOUND 1/4X4 (GAUZE/BANDAGES/DRESSINGS)
COVER BACK TABLE 60X90IN (DRAPES) ×3 IMPLANT
COVER MAYO STAND STRL (DRAPES) ×3 IMPLANT
COVER WAND RF STERILE (DRAPES) IMPLANT
DRAPE SURG 17X23 STRL (DRAPES) ×6 IMPLANT
DRAPE U-SHAPE 76X120 STRL (DRAPES) ×2 IMPLANT
GLOVE BIO SURGEON STRL SZ 6.5 (GLOVE) ×4 IMPLANT
GLOVE BIO SURGEONS STRL SZ 6.5 (GLOVE) ×2
GLOVE BIOGEL M STRL SZ7.5 (GLOVE) ×3 IMPLANT
GOWN STRL REUS W/ TWL LRG LVL3 (GOWN DISPOSABLE) ×1 IMPLANT
GOWN STRL REUS W/TWL LRG LVL3 (GOWN DISPOSABLE) ×3
GOWN STRL REUS W/TWL XL LVL3 (GOWN DISPOSABLE) ×3 IMPLANT
NS IRRIG 1000ML POUR BTL (IV SOLUTION) ×3 IMPLANT
SET BASIN DAY SURGERY F.S. (CUSTOM PROCEDURE TRAY) ×3 IMPLANT
SHEET MEDIUM DRAPE 40X70 STRL (DRAPES) IMPLANT
SPEAR EYE SURG WECK-CEL (MISCELLANEOUS) ×6 IMPLANT
STRIP CLOSURE SKIN 1/4X4 (GAUZE/BANDAGES/DRESSINGS) IMPLANT
SUT 6 0 SILK T G140 8DA (SUTURE) IMPLANT
SUT MERSILENE 6-0 18IN S14 8MM (SUTURE)
SUT PLAIN 6 0 TG1408 (SUTURE) ×2 IMPLANT
SUT SILK 4 0 C 3 735G (SUTURE) IMPLANT
SUT VICRYL 6 0 S 28 (SUTURE) IMPLANT
SUT VICRYL ABS 6-0 S29 18IN (SUTURE) ×4 IMPLANT
SUTURE MERSLN 6-0 18IN S14 8MM (SUTURE) IMPLANT
SYR 10ML LL (SYRINGE) ×3 IMPLANT
SYR TB 1ML LL NO SAFETY (SYRINGE) ×3 IMPLANT
TOWEL GREEN STERILE FF (TOWEL DISPOSABLE) ×3 IMPLANT
TRAY DSU PREP LF (CUSTOM PROCEDURE TRAY) ×3 IMPLANT

## 2019-07-15 NOTE — Anesthesia Procedure Notes (Signed)
Procedure Name: LMA Insertion Date/Time: 07/15/2019 8:59 AM Performed by: Genelle Bal, CRNA Pre-anesthesia Checklist: Patient identified, Emergency Drugs available, Suction available and Patient being monitored Patient Re-evaluated:Patient Re-evaluated prior to induction Oxygen Delivery Method: Circle system utilized Preoxygenation: Pre-oxygenation with 100% oxygen Induction Type: IV induction Ventilation: Mask ventilation without difficulty LMA: LMA inserted LMA Size: 4.0 Number of attempts: 1 Airway Equipment and Method: Bite block Placement Confirmation: positive ETCO2 Tube secured with: Tape Dental Injury: Teeth and Oropharynx as per pre-operative assessment

## 2019-07-15 NOTE — Anesthesia Postprocedure Evaluation (Signed)
Anesthesia Post Note  Patient: Ana Griffin  Procedure(s) Performed: STRABISMUS REPAIR BOTH EYES (Bilateral Eye)     Patient location during evaluation: PACU Anesthesia Type: General Level of consciousness: awake and alert Pain management: pain level controlled Vital Signs Assessment: post-procedure vital signs reviewed and stable Respiratory status: spontaneous breathing, nonlabored ventilation and respiratory function stable Cardiovascular status: blood pressure returned to baseline and stable Postop Assessment: no apparent nausea or vomiting Anesthetic complications: no    Last Vitals:  Vitals:   07/15/19 1030 07/15/19 1045  BP: 117/85 117/79  Pulse: 89 80  Resp: 15 (!) 0  Temp:    SpO2: 98% 99%    Last Pain:  Vitals:   07/15/19 1015  TempSrc:   PainSc: 6                  Catalina Gravel

## 2019-07-15 NOTE — H&P (Signed)
Date of examination:  07-04-19  Indication for surgery: to straighten the eyes and relieve diplopia  Pertinent past medical history:  Past Medical History:  Diagnosis Date  . Allergy   . Anemia   . Anxiety   . 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 Depression   . Heart murmur     Pertinent ocular history:  Hx strabismus in childhood; prism for diplopia x 10 years  Pertinent family history:  Family History  Problem Relation Age of Onset  . Hypertension Mother   . Diabetes Mother   . Stroke Mother   . COPD Mother   . Heart failure Mother   . Mental illness Mother   . Colon polyps Mother   . Cancer Father        Lung cancer  . Breast cancer Maternal Aunt 52  . Cancer Maternal Aunt        BRAIN  . Other Brother        2 positive colo guards- pending colonoscopy 01-13-19 in [place]   . Colon cancer Neg Hx   . Esophageal cancer Neg Hx   . Rectal cancer Neg Hx   . Stomach cancer Neg Hx     General:  Healthy appearing patient in no distress.    Eyes:    Acuity  cc OD 20/20  OS 20/20  External: Within normal limits     Anterior segment: Within normal limits     Motility:   ET=23, ET'=25, RH 1/2  Fundus: deferred  Refraction:  SE -6/-5  Heart: Regular rate and rhythm without murmur     Lungs: Clear to auscultation      Impression:Esotropia, right hypertropia, with diplopia  Plan: Medial rectus muscle recession both eyes.  No vertical surgery for now.  Told patient may need prism after surgery  Derry Skill

## 2019-07-15 NOTE — Transfer of Care (Signed)
Immediate Anesthesia Transfer of Care Note  Patient: Tzivia Loibl Nine  Procedure(s) Performed: STRABISMUS REPAIR BOTH EYES (Bilateral Eye)  Patient Location: PACU  Anesthesia Type:General  Level of Consciousness: drowsy and patient cooperative  Airway & Oxygen Therapy: Patient Spontanous Breathing and Patient connected to face mask oxygen  Post-op Assessment: Report given to RN and Post -op Vital signs reviewed and stable  Post vital signs: Reviewed and stable  Last Vitals:  Vitals Value Taken Time  BP 107/70 07/15/19 0955  Temp    Pulse 104 07/15/19 0956  Resp 10 07/15/19 0956  SpO2 100 % 07/15/19 0956  Vitals shown include unvalidated device data.  Last Pain:  Vitals:   07/15/19 0725  TempSrc: Oral  PainSc: 0-No pain      Patients Stated Pain Goal: 0 ([id number])  Complications: No apparent anesthesia complications

## 2019-07-15 NOTE — Interval H&P Note (Signed)
History and Physical Interval Note:  07/15/2019 9:04 AM  Ana Griffin  has presented today for surgery, with the diagnosis of ESOTROPIA.  The various methods of treatment have been discussed with the patient and family. After consideration of risks, benefits and other options for treatment, the patient has consented to  Procedure(s): STRABISMUS REPAIR BOTH EYES (Bilateral) as a surgical intervention.  The patient's history has been reviewed, patient examined, no change in status, stable for surgery.  I have reviewed the patient's chart and labs.  Questions were answered to the patient's satisfaction.     Derry Skill

## 2019-07-15 NOTE — Anesthesia Preprocedure Evaluation (Signed)
Anesthesia Evaluation  Patient identified by MRN, date of birth, ID band Patient awake    Reviewed: Allergy & Precautions, NPO status , Patient's Chart, lab work & pertinent test results  Airway Mallampati: II  TM Distance: >3 FB Neck ROM: Full    Dental no notable dental hx.    Pulmonary neg pulmonary ROS, former smoker,    Pulmonary exam normal breath sounds clear to auscultation       Cardiovascular negative cardio ROS Normal cardiovascular exam Rhythm:Regular Rate:Normal     Neuro/Psych Anxiety Depression negative neurological ROS  negative psych ROS   GI/Hepatic negative GI ROS, Neg liver ROS,   Endo/Other  negative endocrine ROS  Renal/GU negative Renal ROS  negative genitourinary   Musculoskeletal negative musculoskeletal ROS (+)   Abdominal (+) + obese,   Peds negative pediatric ROS (+)  Hematology negative hematology ROS (+)   Anesthesia Other Findings   Reproductive/Obstetrics negative OB ROS                             Anesthesia Physical Anesthesia Plan  ASA: II  Anesthesia Plan: General   Post-op Pain Management:    Induction: Intravenous  PONV Risk Score and Plan: 3 and Ondansetron, Dexamethasone, Midazolam and Treatment may vary due to age or medical condition  Airway Management Planned: LMA  Additional Equipment:   Intra-op Plan:   Post-operative Plan: Extubation in OR  Informed Consent: I have reviewed the patients History and Physical, chart, labs and discussed the procedure including the risks, benefits and alternatives for the proposed anesthesia with the patient or authorized representative who has indicated his/her understanding and acceptance.     Dental advisory given  Plan Discussed with: CRNA  Anesthesia Plan Comments:         Anesthesia Quick Evaluation

## 2019-07-15 NOTE — Op Note (Signed)
07/15/2019  9:56 AM  PATIENT:  Ana Griffin  54 y.o. female  PRE-OPERATIVE DIAGNOSIS:  Esotropia     POST-OPERATIVE DIAGNOSIS:  Esotropia     PROCEDURE:  Medial rectus muscle recession  4 mm both eye(s)  SURGEON:  Lorne Skeens.Annamaria Boots, M.D.   ANESTHESIA:   general  COMPLICATIONS:None  DESCRIPTION OF PROCEDURE: The patient was taken to the operating room where She was identified by me. General anesthesia was induced without difficulty after placement of appropriate monitors. The patient was prepped and draped in standard sterile fashion. A lid speculum was placed in the left eye.  Through an inferonasal fornix incision through conjunctiva and Tenon's fascia, the left medial rectus muscle was engaged on a series of muscle hooks and cleared of its fascial attachments. The tendon was secured with a double-armed 6-0 Vicryl suture with a double locking bite at each border of the muscle, 1 mm from the insertion. The muscle was disinserted, and was reattached to sclera at a measured distance of 4.0 millimeters posterior to the original insertion, using direct scleral passes in crossed swords fashion.  The suture ends were tied securely after the position of the muscle had been checked and found to be accurate. Conjunctiva was closed with 1 6-0 plain gut suture.  The speculum was transferred to the right eye, where an identical procedure was performed, again effecting a 4.0 millimeters recession of the medial rectus muscle. TobraDex ointment was placed in both eye(s). The patient was awakened without difficulty and taken to the recovery room in stable condition, having suffered no intraoperative or immediate postoperative complications.  Lorne Skeens. Elianis Fischbach M.D.    PATIENT DISPOSITION:  PACU - hemodynamically stable.

## 2019-07-15 NOTE — Discharge Instructions (Signed)
Diet: Clear liquids, advance to soft foods then regular diet as tolerated by this evening. ° °Pain control:  ° 1)  Ibuprofen 600 mg by mouth every 6-8 hours as needed for pain ° 2)  Ice pack/cold compress to operated eye(s) as desired ° °Eye medications:  Tobradex or Zylet eye ointment 1/2 inch in operated eye(s) twice a day if directed to do so by Dr. Young ° °Activity: No swimming for 1 week.  It is OK to let water run over the face and eyes while showering or taking a bath, even during the first week.  No other restriction on exercise or activity. ° ° °Call Dr. Young's office [phone] with any problems or concerns. ° ° ° ° °Post Anesthesia Home Care Instructions ° °Activity: °Get plenty of rest for the remainder of the day. A responsible individual must stay with you for 24 hours following the procedure.  °For the next 24 hours, DO NOT: °-Drive a car °-Operate machinery °-Drink alcoholic beverages °-Take any medication unless instructed by your physician °-Make any legal decisions or sign important papers. ° °Meals: °Start with liquid foods such as gelatin or soup. Progress to regular foods as tolerated. Avoid greasy, spicy, heavy foods. If nausea and/or vomiting occur, drink only clear liquids until the nausea and/or vomiting subsides. Call your physician if vomiting continues. ° °Special Instructions/Symptoms: °Your throat may feel dry or sore from the anesthesia or the breathing tube placed in your throat during surgery. If this causes discomfort, gargle with warm salt water. The discomfort should disappear within 24 hours. ° °If you had a scopolamine patch placed behind your ear for the management of post- operative nausea and/or vomiting: ° °1. The medication in the patch is effective for 72 hours, after which it should be removed.  Wrap patch in a tissue and discard in the trash. Wash hands thoroughly with soap and water. °2. You may remove the patch earlier than 72 hours if you experience unpleasant  side effects which may include dry mouth, dizziness or visual disturbances. °3. Avoid touching the patch. Wash your hands with soap and water after contact with the patch. °   ° °

## 2019-07-15 NOTE — H&P (View-Only) (Signed)
Date of examination:  07-04-19  Indication for surgery: to straighten the eyes and relieve diplopia  Pertinent past medical history:  Past Medical History:  Diagnosis Date  . Allergy   . Anemia   . Anxiety   . 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 Depression   . Heart murmur     Pertinent ocular history:  Hx strabismus in childhood; prism for diplopia x 10 years  Pertinent family history:  Family History  Problem Relation Age of Onset  . Hypertension Mother   . Diabetes Mother   . Stroke Mother   . COPD Mother   . Heart failure Mother   . Mental illness Mother   . Colon polyps Mother   . Cancer Father        Lung cancer  . Breast cancer Maternal Aunt 52  . Cancer Maternal Aunt        BRAIN  . Other Brother        2 positive colo guards- pending colonoscopy 01-13-19 in [place]   . Colon cancer Neg Hx   . Esophageal cancer Neg Hx   . Rectal cancer Neg Hx   . Stomach cancer Neg Hx     General:  Healthy appearing patient in no distress.    Eyes:    Acuity  cc OD 20/20  OS 20/20  External: Within normal limits     Anterior segment: Within normal limits     Motility:   ET=23, ET'=25, RH 1/2  Fundus: deferred  Refraction:  SE -6/-5  Heart: Regular rate and rhythm without murmur     Lungs: Clear to auscultation      Impression:Esotropia, right hypertropia, with diplopia  Plan: Medial rectus muscle recession both eyes.  No vertical surgery for now.  Told patient may need prism after surgery  Derry Skill

## 2019-07-18 ENCOUNTER — Encounter: Payer: Self-pay | Admitting: *Deleted

## 2019-07-27 ENCOUNTER — Encounter: Payer: Self-pay | Admitting: Nurse Practitioner

## 2019-07-27 DIAGNOSIS — F3341 Major depressive disorder, recurrent, in partial remission: Secondary | ICD-10-CM

## 2019-07-27 MED ORDER — BUPROPION HCL ER (XL) 150 MG PO TB24: 150.0000 mg | ORAL_TABLET | Freq: Every day | ORAL | 3 refills | Status: DC

## 2019-10-04 ENCOUNTER — Other Ambulatory Visit: Payer: Self-pay

## 2019-10-05 ENCOUNTER — Ambulatory Visit: Payer: BC Managed Care – PPO | Admitting: Nurse Practitioner

## 2019-10-05 ENCOUNTER — Encounter: Payer: Self-pay | Admitting: Nurse Practitioner

## 2019-10-05 VITALS — BP 122/70 | HR 71 | Temp 98.7°F | Ht 64.0 in | Wt 179.0 lb

## 2019-10-05 DIAGNOSIS — G8929 Other chronic pain: Secondary | ICD-10-CM

## 2019-10-05 DIAGNOSIS — E782 Mixed hyperlipidemia: Secondary | ICD-10-CM | POA: Diagnosis not present

## 2019-10-05 DIAGNOSIS — M545 Low back pain, unspecified: Secondary | ICD-10-CM

## 2019-10-05 DIAGNOSIS — R739 Hyperglycemia, unspecified: Secondary | ICD-10-CM

## 2019-10-05 DIAGNOSIS — F331 Major depressive disorder, recurrent, moderate: Secondary | ICD-10-CM | POA: Diagnosis not present

## 2019-10-05 LAB — BASIC METABOLIC PANEL
BUN: 17 mg/dL (ref 6–23)
CO2: 27 mEq/L (ref 19–32)
Calcium: 10.1 mg/dL (ref 8.4–10.5)
Chloride: 105 mEq/L (ref 96–112)
Creatinine, Ser: 1.2 mg/dL (ref 0.40–1.20)
GFR: 47.13 mL/min — ABNORMAL LOW (ref >60.00)
Glucose, Bld: 107 mg/dL — ABNORMAL HIGH (ref 70–99)
Potassium: 5.2 mEq/L — ABNORMAL HIGH (ref 3.5–5.1)
Sodium: 145 mEq/L (ref 135–145)

## 2019-10-05 LAB — HEMOGLOBIN A1C: Hgb A1c MFr Bld: 5.1 % (ref 4.6–6.5)

## 2019-10-05 LAB — LIPID PANEL
Cholesterol: 228 mg/dL — ABNORMAL HIGH (ref 0–200)
HDL: 55.9 mg/dL (ref >39.00)
LDL Cholesterol: 140 mg/dL — ABNORMAL HIGH (ref 0–99)
NonHDL: 172.55
Total CHOL/HDL Ratio: 4
Triglycerides: 161 mg/dL — ABNORMAL HIGH (ref 0.0–149.0)
VLDL: 32.2 mg/dL (ref 0.0–40.0)

## 2019-10-05 MED ORDER — IBUPROFEN 200 MG PO TABS: 600.0000 mg | ORAL_TABLET | Freq: Three times a day (TID) | ORAL | 0 refills | Status: AC | PRN

## 2019-10-05 MED ORDER — QUETIAPINE FUMARATE 25 MG PO TABS: 25.0000 mg | ORAL_TABLET | Freq: Every day | ORAL | 3 refills | Status: DC

## 2019-10-05 MED ORDER — CYCLOBENZAPRINE HCL 5 MG PO TABS: 5.0000 mg | ORAL_TABLET | Freq: Two times a day (BID) | ORAL | 0 refills | Status: DC | PRN

## 2019-10-05 NOTE — Assessment & Plan Note (Signed)
denies any adverse side effects with fenofibrate Repeat lipid panel today

## 2019-10-05 NOTE — Assessment & Plan Note (Addendum)
Improved mood and sleep with seroquel. PHQ and GAD completed. Wt Readings from Last 3 Encounters:  10/05/19 179 lb (81.2 kg)  07/15/19 177 lb 4 oz (80.4 kg)  07/12/19 175 lb (79.4 kg)   Maintain current medication Check BMP and lipid panel F/up in 83months

## 2019-10-05 NOTE — Patient Instructions (Addendum)
Go to lab for blood draw  Do not combine flexeril and seroquel within 6hrs of each dose  Apply cold compress after back exercise.  Back Exercises These exercises help to make your trunk and back strong. They also help to keep the lower back flexible. Doing these exercises can help to prevent back pain or lessen existing pain.  If you have back pain, try to do these exercises 2-3 times each day or as told by your doctor.  As you get better, do the exercises once each day. Repeat the exercises more often as told by your doctor.  To stop back pain from coming back, do the exercises once each day, or as told by your doctor. Exercises Single knee to chest Do these steps 3-5 times in a row for each leg: 1. Lie on your back on a firm bed or the floor with your legs stretched out. 2. Bring one knee to your chest. 3. Grab your knee or thigh with both hands and hold them it in place. 4. Pull on your knee until you feel a gentle stretch in your lower back or buttocks. 5. Keep doing the stretch for 10-30 seconds. 6. Slowly let go of your leg and straighten it. Pelvic tilt Do these steps 5-10 times in a row: 1. Lie on your back on a firm bed or the floor with your legs stretched out. 2. Bend your knees so they point up to the ceiling. Your feet should be flat on the floor. 3. Tighten your lower belly (abdomen) muscles to press your lower back against the floor. This will make your tailbone point up to the ceiling instead of pointing down to your feet or the floor. 4. Stay in this position for 5-10 seconds while you gently tighten your muscles and breathe evenly. Cat-cow Do these steps until your lower back bends more easily: 1. Get on your hands and knees on a firm surface. Keep your hands under your shoulders, and keep your knees under your hips. You may put padding under your knees. 2. Let your head hang down toward your chest. Tighten (contract) the muscles in your belly. Point your tailbone  toward the floor so your lower back becomes rounded like the back of a cat. 3. Stay in this position for 5 seconds. 4. Slowly lift your head. Let the muscles of your belly relax. Point your tailbone up toward the ceiling so your back forms a sagging arch like the back of a cow. 5. Stay in this position for 5 seconds.  Press-ups Do these steps 5-10 times in a row: 1. Lie on your belly (face-down) on the floor. 2. Place your hands near your head, about shoulder-width apart. 3. While you keep your back relaxed and keep your hips on the floor, slowly straighten your arms to raise the top half of your body and lift your shoulders. Do not use your back muscles. You may change where you place your hands in order to make yourself more comfortable. 4. Stay in this position for 5 seconds. 5. Slowly return to lying flat on the floor.  Bridges Do these steps 10 times in a row: 1. Lie on your back on a firm surface. 2. Bend your knees so they point up to the ceiling. Your feet should be flat on the floor. Your arms should be flat at your sides, next to your body. 3. Tighten your butt muscles and lift your butt off the floor until your waist is almost as  high as your knees. If you do not feel the muscles working in your butt and the back of your thighs, slide your feet 1-2 inches farther away from your butt. 4. Stay in this position for 3-5 seconds. 5. Slowly lower your butt to the floor, and let your butt muscles relax. If this exercise is too easy, try doing it with your arms crossed over your chest. Belly crunches Do these steps 5-10 times in a row: 1. Lie on your back on a firm bed or the floor with your legs stretched out. 2. Bend your knees so they point up to the ceiling. Your feet should be flat on the floor. 3. Cross your arms over your chest. 4. Tip your chin a little bit toward your chest but do not bend your neck. 5. Tighten your belly muscles and slowly raise your chest just enough to lift  your shoulder blades a tiny bit off of the floor. Avoid raising your body higher than that, because it can put too much stress on your low back. 6. Slowly lower your chest and your head to the floor. Back lifts Do these steps 5-10 times in a row: 1. Lie on your belly (face-down) with your arms at your sides, and rest your forehead on the floor. 2. Tighten the muscles in your legs and your butt. 3. Slowly lift your chest off of the floor while you keep your hips on the floor. Keep the back of your head in line with the curve in your back. Look at the floor while you do this. 4. Stay in this position for 3-5 seconds. 5. Slowly lower your chest and your face to the floor. Contact a doctor if:  Your back pain gets a lot worse when you do an exercise.  Your back pain does not get better 2 hours after you exercise. If you have any of these problems, stop doing the exercises. Do not do them again unless your doctor says it is okay. Get help right away if:  You have sudden, very bad back pain. If this happens, stop doing the exercises. Do not do them again unless your doctor says it is okay. This information is not intended to replace advice given to you by your health care provider. Make sure you discuss any questions you have with your health care provider. Document Revised: 11/05/2017 Document Reviewed: 11/05/2017 Elsevier Patient Education  2020 [employer].

## 2019-10-05 NOTE — Progress Notes (Signed)
Subjective:  Patient ID: Ana Griffin, female    DOB: Jun 23, 1965  Age: 54 y.o. MRN: [id number]  CC: Follow-up (Med refill-Seroquel an wanted Cholesterol labs since its been 3 months to get a check-pt fasting)  Back Pain This is a recurrent problem. The current episode started more than 1 year ago. The problem occurs intermittently. The problem has been waxing and waning since onset. The pain is present in the lumbar spine. The quality of the pain is described as aching. The pain does not radiate. The pain is moderate. The pain is the same all the time. The symptoms are aggravated by bending and twisting. Stiffness is present all day. Pertinent negatives include no bladder incontinence, bowel incontinence, dysuria, fever, leg pain, numbness, paresis, paresthesias, pelvic pain, perianal numbness, tingling or weakness. Risk factors include lack of exercise, obesity, poor posture and sedentary lifestyle. She has tried ice and NSAIDs for the symptoms. The treatment provided mild relief.   Depression screen [facility] 2/9 10/05/2019 10/05/2019 07/12/2019  Decreased Interest 1 0 3  Down, Depressed, Hopeless 1 0 3  PHQ - 2 Score 2 0 6  Altered sleeping 1 - 3  Tired, decreased energy 1 - 2  Change in appetite 1 - 2  Feeling bad or failure about yourself  1 - 3  Trouble concentrating 0 - 1  Moving slowly or fidgety/restless 0 - 0  Suicidal thoughts 0 - 1  PHQ-9 Score 6 - 18  Difficult doing work/chores - - -  Some recent data might be hidden   GAD 7 : Generalized Anxiety Score 10/05/2019 07/12/2019 07/07/2018 04/14/2018  Nervous, Anxious, on Edge 1 2 0 0  Control/stop worrying 1 1 1  0  Worry too much - different things 2 1 0 0  Trouble relaxing 1 3 0 1  Restless 1 1 0 1  Easily annoyed or irritable 1 3 1 1   Afraid - awful might happen 0 0 0 0  Total GAD 7 Score 7 11 2 3   Anxiety Difficulty - - - -   Reviewed past Medical, Social and Family history today.  Outpatient Medications Prior to Visit    Medication Sig Dispense Refill  . Ascorbic Acid (VITAMIN C) 1000 MG tablet Take 1,000 mg by mouth daily.    Marland Kitchen aspirin 81 MG tablet Take 81 mg by mouth daily.    Marland Kitchen b complex vitamins tablet Take 1 tablet by mouth daily.    . Bacillus Coagulans-Inulin (ALIGN PREBIOTIC-PROBIOTIC) 5-1.25 MG-GM CHEW Chew by mouth.    Marland Kitchen BIOTIN PO Take 1 tablet by mouth daily. Reported on 08/04/2015    . buPROPion (WELLBUTRIN XL) 150 MG 24 hr tablet Take 1 tablet (150 mg total) by mouth daily. 90 tablet 3  . Calcium-Magnesium-Vitamin D (CALCIUM MAGNESIUM PO) Take 333 mg by mouth.    . Cholecalciferol (VITAMIN D3) 50 MCG (2000 UT) TABS Take by mouth.    . fenofibrate 54 MG tablet Take 1 tablet (54 mg total) by mouth daily. 90 tablet 1  . Ferrous Sulfate (IRON PO) Take 65 mg by mouth.     . fish oil-omega-3 fatty acids 1000 MG capsule Take 2 g by mouth daily.    Nyoka Cowden Tea, Camellia sinensis, (GREEN TEA PO) Take 400 mg by mouth.    Marland Kitchen OVER THE COUNTER MEDICATION Thyroid Support Thomas Hoff) Stress support    . OVER THE COUNTER MEDICATION 553 mg. Tumeric    . Probiotic Product (PROBIOTIC DAILY PO) Take by mouth.    Marland Kitchen  QUEtiapine (SEROQUEL) 25 MG tablet Take 1 tablet (25 mg total) by mouth at bedtime. 30 tablet 2   No facility-administered medications prior to visit.    ROS See HPI  Objective:  BP 122/70   Pulse 71   Temp 98.7 F (37.1 C) (Tympanic)   Ht 5\' 4"  (1.626 m)   Wt 179 lb (81.2 kg)   SpO2 98%   BMI 30.73 kg/m   Physical Exam Vitals reviewed.  Constitutional:      Appearance: She is obese.  Cardiovascular:     Rate and Rhythm: Normal rate and regular rhythm.     Pulses: Normal pulses.     Heart sounds: Normal heart sounds.  Pulmonary:     Effort: Pulmonary effort is normal.     Breath sounds: Normal breath sounds.  Abdominal:     General: There is no distension.     Palpations: Abdomen is soft.     Tenderness: There is no abdominal tenderness.  Musculoskeletal:     Right lower leg: No  edema.     Left lower leg: No edema.  Skin:    General: Skin is warm and dry.     Findings: No erythema or rash.  Neurological:     Mental Status: She is alert and oriented to person, place, and time.  Psychiatric:        Attention and Perception: Attention normal.        Mood and Affect: Mood normal.        Speech: Speech normal.        Behavior: Behavior normal.        Thought Content: Thought content normal.        Cognition and Memory: Cognition normal.        Judgment: Judgment normal.    Assessment & Plan:  This visit occurred during the SARS-CoV-2 public health emergency.  Safety protocols were in place, including screening questions prior to the visit, additional usage of staff PPE, and extensive cleaning of exam room while observing appropriate contact time as indicated for disinfecting solutions.   Ana Griffin was seen today for follow-up.  Diagnoses and all orders for this visit:  Mixed hyperlipidemia -     Lipid panel  Moderate episode of recurrent major depressive disorder (HCC) -     Basic metabolic panel -     QUEtiapine (SEROQUEL) 25 MG tablet; Take 1 tablet (25 mg total) by mouth at bedtime.  Hyperglycemia -     Hemoglobin A1c  Chronic bilateral low back pain without sciatica -     cyclobenzaprine (FLEXERIL) 5 MG tablet; Take 1-2 tablets (5-10 mg total) by mouth 2 (two) times daily as needed for muscle spasms. -     ibuprofen (ADVIL) 200 MG tablet; Take 3 tablets (600 mg total) by mouth every 8 (eight) hours as needed (with food).    Problem List Items Addressed This Visit      Other   Chronic bilateral low back pain without sciatica   Relevant Medications   cyclobenzaprine (FLEXERIL) 5 MG tablet   ibuprofen (ADVIL) 200 MG tablet   Depression    Improved mood and sleep with seroquel. PHQ and GAD completed. Wt Readings from Last 3 Encounters:  10/05/19 179 lb (81.2 kg)  07/15/19 177 lb 4 oz (80.4 kg)  07/12/19 175 lb (79.4 kg)   Maintain current  medication Check BMP and lipid panel F/up in 82months      Relevant Medications   QUEtiapine (SEROQUEL) 25  MG tablet   Other Relevant Orders   Basic metabolic panel (Completed)   Mixed hyperlipidemia - Primary    denies any adverse side effects with fenofibrate Repeat lipid panel today      Relevant Orders   Lipid panel (Completed)    Other Visit Diagnoses    Hyperglycemia       Relevant Orders   Hemoglobin A1c (Completed)      Follow-up: Return in about 6 months (around 04/06/2020) for Depression and , hyperlipidemia (F2F, 13mins).  Wilfred Lacy, NP

## 2020-01-01 ENCOUNTER — Other Ambulatory Visit: Payer: Self-pay | Admitting: Nurse Practitioner

## 2020-01-01 DIAGNOSIS — E782 Mixed hyperlipidemia: Secondary | ICD-10-CM

## 2020-01-02 NOTE — Telephone Encounter (Signed)
Last OV 10/05/19 Last fill 07/12/19  #90/1

## 2020-04-03 ENCOUNTER — Encounter: Payer: Self-pay | Admitting: Nurse Practitioner

## 2020-05-04 ENCOUNTER — Encounter: Payer: BC Managed Care – PPO | Admitting: Nurse Practitioner

## 2020-05-10 ENCOUNTER — Encounter: Payer: Self-pay | Admitting: Nurse Practitioner

## 2020-05-10 DIAGNOSIS — F5102 Adjustment insomnia: Secondary | ICD-10-CM

## 2020-05-10 DIAGNOSIS — F331 Major depressive disorder, recurrent, moderate: Secondary | ICD-10-CM

## 2020-05-15 MED ORDER — TRAZODONE HCL 50 MG PO TABS: 25.0000 mg | ORAL_TABLET | Freq: Every evening | ORAL | 2 refills | Status: AC | PRN

## 2020-05-15 NOTE — Addendum Note (Signed)
Addended by: Leana Gamer on: 05/15/2020 12:39 PM   Modules accepted: Orders

## 2020-05-31 ENCOUNTER — Other Ambulatory Visit (HOSPITAL_COMMUNITY)
Admission: RE | Admit: 2020-05-31 | Discharge: 2020-05-31 | Disposition: A | Discharge status: Home / Self Care | Payer: BC Managed Care – PPO | Source: Ambulatory Visit | Attending: Nurse Practitioner | Admitting: Nurse Practitioner

## 2020-05-31 ENCOUNTER — Ambulatory Visit (INDEPENDENT_AMBULATORY_CARE_PROVIDER_SITE_OTHER): Payer: BC Managed Care – PPO | Admitting: Nurse Practitioner

## 2020-05-31 ENCOUNTER — Other Ambulatory Visit: Payer: Self-pay

## 2020-05-31 ENCOUNTER — Encounter: Payer: Self-pay | Admitting: Nurse Practitioner

## 2020-05-31 VITALS — BP 118/70 | HR 66 | Temp 97.9°F | Ht 63.5 in | Wt 167.6 lb

## 2020-05-31 DIAGNOSIS — F3342 Major depressive disorder, recurrent, in full remission: Secondary | ICD-10-CM

## 2020-05-31 DIAGNOSIS — M545 Low back pain, unspecified: Secondary | ICD-10-CM

## 2020-05-31 DIAGNOSIS — Z113 Encounter for screening for infections with a predominantly sexual mode of transmission: Secondary | ICD-10-CM

## 2020-05-31 DIAGNOSIS — Z0001 Encounter for general adult medical examination with abnormal findings: Secondary | ICD-10-CM | POA: Insufficient documentation

## 2020-05-31 DIAGNOSIS — D508 Other iron deficiency anemias: Secondary | ICD-10-CM | POA: Diagnosis not present

## 2020-05-31 DIAGNOSIS — E782 Mixed hyperlipidemia: Secondary | ICD-10-CM | POA: Diagnosis not present

## 2020-05-31 DIAGNOSIS — Z124 Encounter for screening for malignant neoplasm of cervix: Secondary | ICD-10-CM | POA: Insufficient documentation

## 2020-05-31 DIAGNOSIS — G8929 Other chronic pain: Secondary | ICD-10-CM

## 2020-05-31 LAB — COMPREHENSIVE METABOLIC PANEL
ALT: 12 U/L (ref 0–35)
AST: 20 U/L (ref 0–37)
Albumin: 4.9 g/dL (ref 3.5–5.2)
Alkaline Phosphatase: 73 U/L (ref 39–117)
BUN: 14 mg/dL (ref 6–23)
CO2: 31 mEq/L (ref 19–32)
Calcium: 10 mg/dL (ref 8.4–10.5)
Chloride: 100 mEq/L (ref 96–112)
Creatinine, Ser: 1.07 mg/dL (ref 0.40–1.20)
GFR: 59.56 mL/min — ABNORMAL LOW (ref >60.00)
Glucose, Bld: 86 mg/dL (ref 70–99)
Potassium: 3.7 mEq/L (ref 3.5–5.1)
Sodium: 138 mEq/L (ref 135–145)
Total Bilirubin: 0.5 mg/dL (ref 0.2–1.2)
Total Protein: 8 g/dL (ref 6.0–8.3)

## 2020-05-31 LAB — CBC
HCT: 41.7 % (ref 36.0–46.0)
Hemoglobin: 14.3 g/dL (ref 12.0–15.0)
MCHC: 34.2 g/dL (ref 30.0–36.0)
MCV: 89.1 fl (ref 78.0–100.0)
Platelets: 280 10*3/uL (ref 150.0–400.0)
RBC: 4.68 Mil/uL (ref 3.87–5.11)
RDW: 12.9 % (ref 11.5–15.5)
WBC: 5.3 10*3/uL (ref 4.0–10.5)

## 2020-05-31 LAB — LIPID PANEL
Cholesterol: 227 mg/dL — ABNORMAL HIGH (ref 0–200)
HDL: 58 mg/dL (ref >39.00)
LDL Cholesterol: 137 mg/dL — ABNORMAL HIGH (ref 0–99)
NonHDL: 168.82
Total CHOL/HDL Ratio: 4
Triglycerides: 159 mg/dL — ABNORMAL HIGH (ref 0.0–149.0)
VLDL: 31.8 mg/dL (ref 0.0–40.0)

## 2020-05-31 LAB — TSH: TSH: 1.46 u[IU]/mL (ref 0.35–4.50)

## 2020-05-31 LAB — FERRITIN: Ferritin: 42 ng/mL (ref 10.0–291.0)

## 2020-05-31 MED ORDER — BUPROPION HCL ER (XL) 150 MG PO TB24: 150.0000 mg | ORAL_TABLET | Freq: Every day | ORAL | 3 refills | Status: AC

## 2020-05-31 MED ORDER — CYCLOBENZAPRINE HCL 5 MG PO TABS: 5.0000 mg | ORAL_TABLET | Freq: Two times a day (BID) | ORAL | 1 refills | Status: AC | PRN

## 2020-05-31 NOTE — Assessment & Plan Note (Signed)
Stable mood with wellbutrin and trazodone, but developed mild headache. Does not want to change rx at this time. Maintain current medication.

## 2020-05-31 NOTE — Assessment & Plan Note (Signed)
Repeat lipid panel ?

## 2020-05-31 NOTE — Patient Instructions (Addendum)
If headaches persistent with trazodone, will need to revisit use of lexapro or zoloft or elavil.  Go to lab for blood draw.  Continue daily exercise and heart healthy diet.  Continue counseling sessions.  Schedule appt for annual eye exam and dental cleaning every 37month.  Schedule appt for annual mammogram  Preventive Care [id number] Old, Female Preventive care refers to lifestyle choices and visits with your health care provider that can promote health and wellness. This includes:  A yearly physical exam. This is also called an annual wellness visit.  Regular dental and eye exams.  Immunizations.  Screening for certain conditions.  Healthy lifestyle choices, such as: ? Eating a healthy diet. ? Getting regular exercise. ? Not using drugs or products that contain nicotine and tobacco. ? Limiting alcohol use. What can I expect for my preventive care visit? Physical exam Your health care provider will check your:  Height and weight. These may be used to calculate your BMI (body mass index). BMI is a measurement that tells if you are at a healthy weight.  Heart rate and blood pressure.  Body temperature.  Skin for abnormal spots. Counseling Your health care provider may ask you questions about your:  Past medical problems.  Family's medical history.  Alcohol, tobacco, and drug use.  Emotional well-being.  Home life and relationship well-being.  Sexual activity.  Diet, exercise, and sleep habits.  Work and work eStatistician  Access to firearms.  Method of birth control.  Menstrual cycle.  Pregnancy history. What immunizations do I need? Vaccines are usually given at various ages, according to a schedule. Your health care provider will recommend vaccines for you based on your age, medical history, and lifestyle or other factors, such as travel or where you work.   What tests do I need? Blood tests  Lipid and cholesterol levels. These may be checked  every 5 years, or more often if you are over 53years old.  Hepatitis C test.  Hepatitis B test. Screening  Lung cancer screening. You may have this screening every year starting at age 53952if you have a 30-pack-year history of smoking and currently smoke or have quit within the past 15 years.  Colorectal cancer screening. ? All adults should have this screening starting at age 5376and continuing until age 19378 ? Your health care provider may recommend screening at age 5310if you are at increased risk. ? You will have tests every 1-10 years, depending on your results and the type of screening test.  Diabetes screening. ? This is done by checking your blood sugar (glucose) after you have not eaten for a while (fasting). ? You may have this done every 1-3 years.  Mammogram. ? This may be done every 1-2 years. ? Talk with your health care provider about when you should start having regular mammograms. This may depend on whether you have a family history of breast cancer.  BRCA-related cancer screening. This may be done if you have a family history of breast, ovarian, tubal, or peritoneal cancers.  Pelvic exam and Pap test. ? This may be done every 3 years starting at age 286 ? Starting at age 53 this may be done every 5 years if you have a Pap test in combination with an HPV test. Other tests  STD (sexually transmitted disease) testing, if you are at risk.  Bone density scan. This is done to screen for osteoporosis. You may have this scan if you are at high risk  for osteoporosis. Talk with your health care provider about your test results, treatment options, and if necessary, the need for more tests. Follow these instructions at home: Eating and drinking  Eat a diet that includes fresh fruits and vegetables, whole grains, lean protein, and low-fat dairy products.  Take vitamin and mineral supplements as recommended by your health care provider.  Do not drink alcohol if: ? Your  health care provider tells you not to drink. ? You are pregnant, may be pregnant, or are planning to become pregnant.  If you drink alcohol: ? Limit how much you have to 0-1 drink a day. ? Be aware of how much alcohol is in your drink. In the [place] one drink equals one 12 oz bottle of beer (355 mL), one 5 oz glass of wine (148 mL), or one 1 oz glass of hard liquor (44 mL).   Lifestyle  Take daily care of your teeth and gums. Brush your teeth every morning and night with fluoride toothpaste. Floss one time each day.  Stay active. Exercise for at least 30 minutes 5 or more days each week.  Do not use any products that contain nicotine or tobacco, such as cigarettes, e-cigarettes, and chewing tobacco. If you need help quitting, ask your health care provider.  Do not use drugs.  If you are sexually active, practice safe sex. Use a condom or other form of protection to prevent STIs (sexually transmitted infections).  If you do not wish to become pregnant, use a form of birth control. If you plan to become pregnant, see your health care provider for a prepregnancy visit.  If told by your health care provider, take low-dose aspirin daily starting at age 61.  Find healthy ways to cope with stress, such as: ? Meditation, yoga, or listening to music. ? Journaling. ? Talking to a trusted person. ? Spending time with friends and family. Safety  Always wear your seat belt while driving or riding in a vehicle.  Do not drive: ? If you have been drinking alcohol. Do not ride with someone who has been drinking. ? When you are tired or distracted. ? While texting.  Wear a helmet and other protective equipment during sports activities.  If you have firearms in your house, make sure you follow all gun safety procedures. What's next?  Visit your health care provider once a year for an annual wellness visit.  Ask your health care provider how often you should have your eyes and teeth  checked.  Stay up to date on all vaccines. This information is not intended to replace advice given to you by your health care provider. Make sure you discuss any questions you have with your health care provider. Document Revised: 11/15/2019 Document Reviewed: 10/22/2017 Elsevier Patient Education  2021 [employer].

## 2020-05-31 NOTE — Progress Notes (Signed)
Subjective:    Patient ID: Ana Griffin, female    DOB: [birth year], 55 y.o.   MRN: [id number]  Patient presents today for CPE and eval of chronic conditions  HPI Depression Stable mood with wellbutrin and trazodone, but developed mild headache. Does not want to change rx at this time. Maintain current medication.  Sexual History (orientation,birth control, marital status, STD):sexually active, female partner, agreed to STD screen  Depression/Suicide: Depression screen [facility] 2/9 05/31/2020 10/05/2019 10/05/2019 07/12/2019 07/12/2019 12/08/2018 07/07/2018  Decreased Interest 0 1 0 3 0 0 0  Down, Depressed, Hopeless 0 1 0 3 0 0 1  PHQ - 2 Score 0 2 0 6 0 0 1  Altered sleeping 1 1 - 3 - - 1  Tired, decreased energy 1 1 - 2 - - 0  Change in appetite 0 1 - 2 - - 0  Feeling bad or failure about yourself  0 1 - 3 - - 0  Trouble concentrating 0 0 - 1 - - 0  Moving slowly or fidgety/restless 0 0 - 0 - - 0  Suicidal thoughts 0 0 - 1 - - 0  PHQ-9 Score 2 6 - 18 - - 2  Difficult doing work/chores Not difficult at all - - - - - -  Some recent data might be hidden   GAD 7 : Generalized Anxiety Score 05/31/2020 10/05/2019 07/12/2019 07/07/2018  Nervous, Anxious, on Edge 0 1 2 0  Control/stop worrying 0 1 1 1   Worry too much - different things 0 2 1 0  Trouble relaxing 0 1 3 0  Restless 0 1 1 0  Easily annoyed or irritable 1 1 3 1   Afraid - awful might happen 1 0 0 0  Total GAD 7 Score 2 7 11 2   Anxiety Difficulty Not difficult at all - - -   Vision:will schedule  Dental:will schedule  Immunizations: (TDAP, Hep C screen, Pneumovax, Influenza, zoster)  Health Maintenance  Topic Date Due  . HIV Screening  Never done  . COVID-19 Vaccine (3 - Booster for Pfizer series) 12/09/2019  . Pap Smear  02/19/2020  . Flu Shot  09/24/2020  . Mammogram  05/23/2021  . Tetanus Vaccine  10/20/2021  . Colon Cancer Screening  01/17/2022  .  Hepatitis C: One time screening is recommended by Center for  Disease Control  (CDC) for  adults born from [birth year] through 1963.   Completed  . HPV Vaccine  Aged Out   Diet:regular. Exercise: walking Weight:  Wt Readings from Last 3 Encounters:  05/31/20 167 lb 9.6 oz (76 kg)  10/05/19 179 lb (81.2 kg)  07/15/19 177 lb 4 oz (80.4 kg)   Fall Risk: Fall Risk  10/05/2019 07/12/2019 12/08/2018 04/14/2018 05/15/2017 02/18/2017 12/25/2016  Falls in the past year? 0 0 0 0 No No No  Number falls in past yr: 0 0 - - - - -  Injury with Fall? 0 0 - - - - -   Medications and allergies reviewed with patient and updated if appropriate.  Patient Active Problem List   Diagnosis Date Noted  . Chronic bilateral low back pain without sciatica 10/05/2019  . Sensorineural hearing loss (SNHL) of both ears 07/12/2019  . Iron deficiency anemia secondary to inadequate dietary iron intake 02/15/2019  . Family history of malignant neoplasm of colon in first degree relative diagnosed when younger than 53 years of age 68/14/2020  . Panic attacks 12/08/2018  . Mixed hyperlipidemia 04/15/2018  .  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 Depression 10/21/2011  . Weight gain 10/21/2011    Current Outpatient Medications on File Prior to Visit  Medication Sig Dispense Refill  . Ascorbic Acid (VITAMIN C) 1000 MG tablet Take 1,000 mg by mouth daily.    Marland Kitchen aspirin 81 MG tablet Take 81 mg by mouth daily.    Marland Kitchen b complex vitamins tablet Take 1 tablet by mouth daily.    . Bacillus Coagulans-Inulin (ALIGN PREBIOTIC-PROBIOTIC) 5-1.25 MG-GM CHEW Chew by mouth.    Marland Kitchen BIOTIN PO Take 1 tablet by mouth daily. Reported on 08/04/2015    . Calcium-Magnesium-Vitamin D (CALCIUM MAGNESIUM PO) Take 333 mg by mouth.    . Cholecalciferol (VITAMIN D3) 50 MCG (2000 UT) TABS Take by mouth.    . fenofibrate 54 MG tablet TAKE 1 TABLET(54 MG) BY MOUTH DAILY 90 tablet 1  . Ferrous Sulfate (IRON PO) Take 65 mg by mouth.     . fish oil-omega-3 fatty acids 1000 MG capsule Take 2 g by mouth daily.    Nyoka Cowden Tea,  Camellia sinensis, (GREEN TEA PO) Take 400 mg by mouth.    Marland Kitchen ibuprofen (ADVIL) 200 MG tablet Take 3 tablets (600 mg total) by mouth every 8 (eight) hours as needed (with food). 30 tablet 0  . OVER THE COUNTER MEDICATION Thyroid Support Thomas Hoff) Stress support    . OVER THE COUNTER MEDICATION 553 mg. Tumeric    . Probiotic Product (PROBIOTIC DAILY PO) Take by mouth.    . traZODone (DESYREL) 50 MG tablet Take 0.5-1 tablets (25-50 mg total) by mouth at bedtime as needed for sleep. 30 tablet 2   No current facility-administered medications on file prior to visit.    Past Medical History:  Diagnosis Date  . Allergy   . Anemia   . Anxiety   . 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 Depression   . Heart murmur     Past Surgical History:  Procedure Laterality Date  . CERVICAL BIOPSY  W/ LOOP ELECTRODE EXCISION  01/2002  . COLPOSCOPY    . STRABISMUS SURGERY Bilateral 07/15/2019   Procedure: STRABISMUS REPAIR BOTH EYES;  Surgeon: Everitt Amber, MD;  Location: [facility];  Service: Ophthalmology;  Laterality: Bilateral;  . sutures in nose     age 83 with sedation  . WISDOM TOOTH EXTRACTION      Social History   Socioeconomic History  . Marital status: Single    Spouse name: Not on file  . Number of children: 2  . Years of education: Not on file  . Highest education level: Bachelor's degree (e.g., BA, AB, BS)  Occupational History  . 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 Smoking status: Former Smoker    Types: Cigarettes    Quit date: 10/21/2003    Years since quitting: 16.6  . Smokeless tobacco: Never Used  Vaping Use  . Vaping Use: Never used  Substance and Sexual Activity  . Alcohol use: Yes    Comment: OCC  . Drug use: Yes    Types: Marijuana    Comment: occassionally, 3x a year- last used months ago  . Sexual activity: Yes    Partners: Female, Female    Birth control/protection: None, Post-menopausal    Comment: female partner   Other Topics Concern  . Not on file  Social History Narrative   Moved here from [place] in 1992. Lives at home with 2 adopted children and  grandchildren.       Diet: Eats "unhealthy things." Likes frozen pizza, breads, [place], processed food. Caffeine intake has increased, doing a redbull a day. Takes multivitamins daily.       Not currently engaging in structured exercise.    Social Determinants of Health   Financial Resource Strain: Not on file  Food Insecurity: Not on file  Transportation Needs: Not on file  Physical Activity: Not on file  Stress: Not on file  Social Connections: Not on file    Family History  Problem Relation Age of Onset  . Hypertension Mother   . Diabetes Mother   . Stroke Mother   . COPD Mother   . Heart failure Mother   . Mental illness Mother   . Colon polyps Mother   . Cancer Father        Lung cancer  . Breast cancer Maternal Aunt 52  . Cancer Maternal Aunt        BRAIN  . Other Brother        2 positive colo guards- pending colonoscopy 01-13-19 in [place]   . Colon cancer Neg Hx   . Esophageal cancer Neg Hx   . Rectal cancer Neg Hx   . Stomach cancer Neg Hx         Review of Systems  Constitutional: Negative for fever, malaise/fatigue and weight loss.  HENT: Negative for congestion and sore throat.   Eyes:       Negative for visual changes  Respiratory: Negative for cough and shortness of breath.   Cardiovascular: Negative for chest pain, palpitations and leg swelling.  Gastrointestinal: Negative for blood in stool, constipation, diarrhea and heartburn.  Genitourinary: Negative for dysuria, frequency and urgency.  Musculoskeletal: Negative for falls, joint pain and myalgias.  Skin: Negative for rash.  Neurological: Negative for dizziness, sensory change and headaches.  Endo/Heme/Allergies: Does not bruise/bleed easily.  Psychiatric/Behavioral: Negative for depression, substance abuse and suicidal ideas. The patient is not  nervous/anxious.    Objective:   Vitals:   05/31/20 1305  BP: 118/70  Pulse: 66  Temp: 97.9 F (36.6 C)  SpO2: 97%   Body mass index is 29.22 kg/m.  Physical Examination:  Physical Exam Vitals reviewed. Exam conducted with a chaperone present.  Constitutional:      General: She is not in acute distress.    Appearance: She is obese.  HENT:     Right Ear: Tympanic membrane, ear canal and external ear normal.     Left Ear: Tympanic membrane, ear canal and external ear normal.  Eyes:     General: No scleral icterus.    Extraocular Movements: Extraocular movements intact.     Conjunctiva/sclera: Conjunctivae normal.  Neck:     Thyroid: No thyromegaly.  Cardiovascular:     Rate and Rhythm: Normal rate and regular rhythm.     Pulses: Normal pulses.     Heart sounds: Normal heart sounds.  Pulmonary:     Effort: Pulmonary effort is normal.     Breath sounds: Normal breath sounds.  Chest:     Chest wall: No tenderness.  Breasts:     Right: Normal. No axillary adenopathy or supraclavicular adenopathy.     Left: Normal. No axillary adenopathy or supraclavicular adenopathy.    Abdominal:     General: Bowel sounds are normal. There is no distension.     Palpations: Abdomen is soft.     Tenderness: There is no abdominal tenderness.     Hernia:  There is no hernia in the left inguinal area or right inguinal area.  Genitourinary:    General: Normal vulva.     Labia:        Right: No rash or tenderness.        Left: No rash or tenderness.      Vagina: Normal.     Cervix: Normal.     Uterus: Normal.      Adnexa: Right adnexa normal and left adnexa normal.     Rectum: No external hemorrhoid.  Musculoskeletal:        General: No tenderness. Normal range of motion.     Cervical back: Normal range of motion and neck supple.  Lymphadenopathy:     Cervical: No cervical adenopathy.     Upper Body:     Right upper body: No supraclavicular, axillary or pectoral adenopathy.      Left upper body: No supraclavicular, axillary or pectoral adenopathy.     Lower Body: No right inguinal adenopathy. No left inguinal adenopathy.  Skin:    General: Skin is warm and dry.  Neurological:     Mental Status: She is alert and oriented to person, place, and time.  Psychiatric:        Mood and Affect: Mood normal.        Behavior: Behavior normal.        Thought Content: Thought content normal.        Judgment: Judgment normal.     ASSESSMENT and PLAN: This visit occurred during the SARS-CoV-2 public health emergency.  Safety protocols were in place, including screening questions prior to the visit, additional usage of staff PPE, and extensive cleaning of exam room while observing appropriate contact time as indicated for disinfecting solutions.   Ana Griffin was seen today for establish care.  Diagnoses and all orders for this visit:  Encounter for preventative adult health care exam with abnormal findings -     Comprehensive metabolic panel -     TSH -     Cytology - PAP( [place])  Mixed hyperlipidemia -     Lipid panel  Iron deficiency anemia secondary to inadequate dietary iron intake -     CBC -     Ferritin  Encounter for Papanicolaou smear for cervical cancer screening -     Cytology - PAP( [place])  Screen for STD (sexually transmitted disease) -     HIV antibody (with reflex) -     RPR -     Cervicovaginal ancillary only( [place]) -     Hepatitis C Antibody  Chronic bilateral low back pain without sciatica -     cyclobenzaprine (FLEXERIL) 5 MG tablet; Take 1-2 tablets (5-10 mg total) by mouth 2 (two) times daily as needed for muscle spasms.  Recurrent major depressive disorder, in full remission ([place]) -     buPROPion (WELLBUTRIN XL) 150 MG 24 hr tablet; Take 1 tablet (150 mg total) by mouth daily.      Problem List Items Addressed This Visit      Other   Chronic bilateral low back pain without sciatica   Relevant Medications    cyclobenzaprine (FLEXERIL) 5 MG tablet   Depression    Stable mood with wellbutrin and trazodone, but developed mild headache. Does not want to change rx at this time. Maintain current medication.      Relevant Medications   buPROPion (WELLBUTRIN XL) 150 MG 24 hr tablet   Iron deficiency anemia secondary to  inadequate dietary iron intake   Relevant Orders   CBC   Ferritin   Mixed hyperlipidemia   Relevant Orders   Lipid panel    Other Visit Diagnoses    Encounter for preventative adult health care exam with abnormal findings    -  Primary   Relevant Orders   Comprehensive metabolic panel   TSH   Cytology - PAP( [place])   Encounter for Papanicolaou smear for cervical cancer screening       Relevant Orders   Cytology - PAP( [place])   Screen for STD (sexually transmitted disease)       Relevant Orders   HIV antibody (with reflex)   RPR   Cervicovaginal ancillary only( [place])   Hepatitis C Antibody      Follow up: Return in about 3 months (around 08/30/2020) for anxiety and depression (54mins).  Wilfred Lacy, NP

## 2020-06-01 LAB — HEPATITIS C ANTIBODY
Hepatitis C Ab: NONREACTIVE
SIGNAL TO CUT-OFF: 0.01 (ref <1.00)

## 2020-06-01 LAB — CYTOLOGY - PAP
Comment: NEGATIVE
Diagnosis: NEGATIVE
High risk HPV: NEGATIVE

## 2020-06-01 LAB — RPR: RPR Ser Ql: NONREACTIVE

## 2020-06-01 LAB — HIV ANTIBODY (ROUTINE TESTING W REFLEX): HIV 1&2 Ab, 4th Generation: NONREACTIVE

## 2020-06-01 MED ORDER — ATORVASTATIN CALCIUM 20 MG PO TABS
20.0000 mg | ORAL_TABLET | Freq: Every day | ORAL | 1 refills | Status: AC
Start: 2020-06-01 — End: ?

## 2020-06-01 MED ORDER — FENOFIBRATE 54 MG PO TABS: 54.0000 mg | ORAL_TABLET | Freq: Every day | ORAL | 1 refills | Status: DC

## 2020-06-04 ENCOUNTER — Other Ambulatory Visit: Payer: Self-pay | Admitting: Nurse Practitioner

## 2020-06-04 DIAGNOSIS — Z1231 Encounter for screening mammogram for malignant neoplasm of breast: Secondary | ICD-10-CM

## 2020-06-05 LAB — CERVICOVAGINAL ANCILLARY ONLY
Chlamydia: NEGATIVE
Comment: NEGATIVE
Comment: NEGATIVE
Comment: NEGATIVE
Comment: NORMAL
HSV1: NEGATIVE
HSV2: NEGATIVE
Neisseria Gonorrhea: NEGATIVE
Trichomonas: NEGATIVE

## 2020-06-27 ENCOUNTER — Other Ambulatory Visit: Payer: Self-pay

## 2020-06-27 ENCOUNTER — Ambulatory Visit
Admission: RE | Admit: 2020-06-27 | Discharge: 2020-06-27 | Disposition: A | Discharge status: Home / Self Care | Payer: BC Managed Care – PPO | Source: Ambulatory Visit

## 2020-06-27 DIAGNOSIS — Z1231 Encounter for screening mammogram for malignant neoplasm of breast: Secondary | ICD-10-CM

## 2020-06-30 ENCOUNTER — Other Ambulatory Visit: Payer: Self-pay | Admitting: Nurse Practitioner

## 2020-06-30 DIAGNOSIS — E782 Mixed hyperlipidemia: Secondary | ICD-10-CM

## 2020-06-30 NOTE — Telephone Encounter (Signed)
Last OV 05/31/20 Last fill 06/01/20  #90/1

## 2020-07-07 ENCOUNTER — Other Ambulatory Visit: Payer: Self-pay | Admitting: Nurse Practitioner

## 2020-07-07 DIAGNOSIS — F3342 Major depressive disorder, recurrent, in full remission: Secondary | ICD-10-CM

## 2022-01-01 ENCOUNTER — Encounter: Payer: Self-pay | Admitting: Gastroenterology

## 2022-03-24 IMAGING — MG DIGITAL SCREENING BILAT W/ TOMO W/ CAD
8 series · 8 of 24 positions shown · non-contrast
Comparison: Previous exam(s).

CLINICAL DATA: Screening.

EXAM:
DIGITAL SCREENING BILATERAL MAMMOGRAM WITH TOMO AND CAD

[R CC synth-2D]
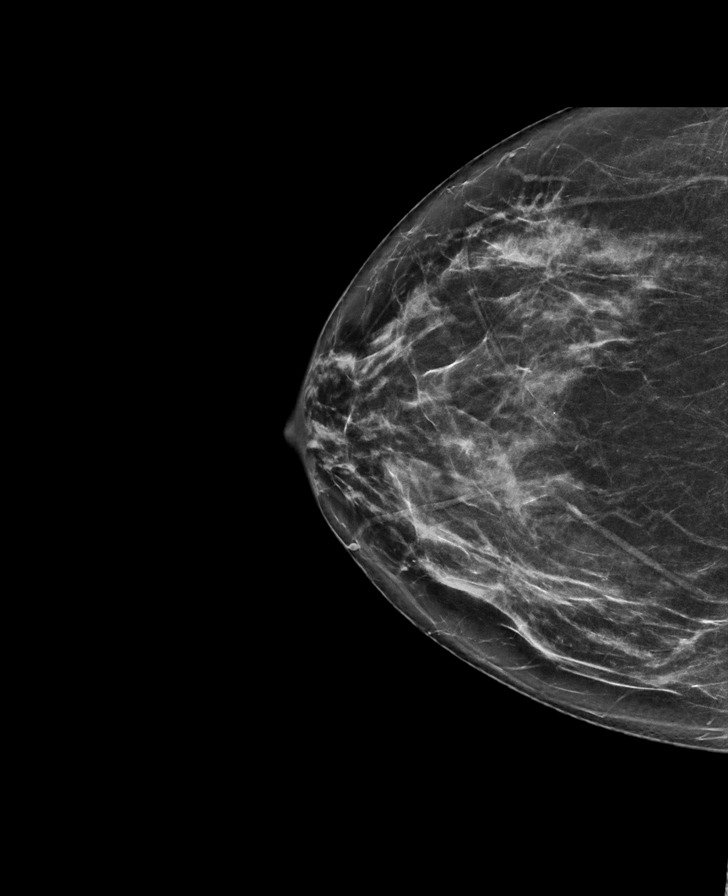

[L MLO synth-2D]
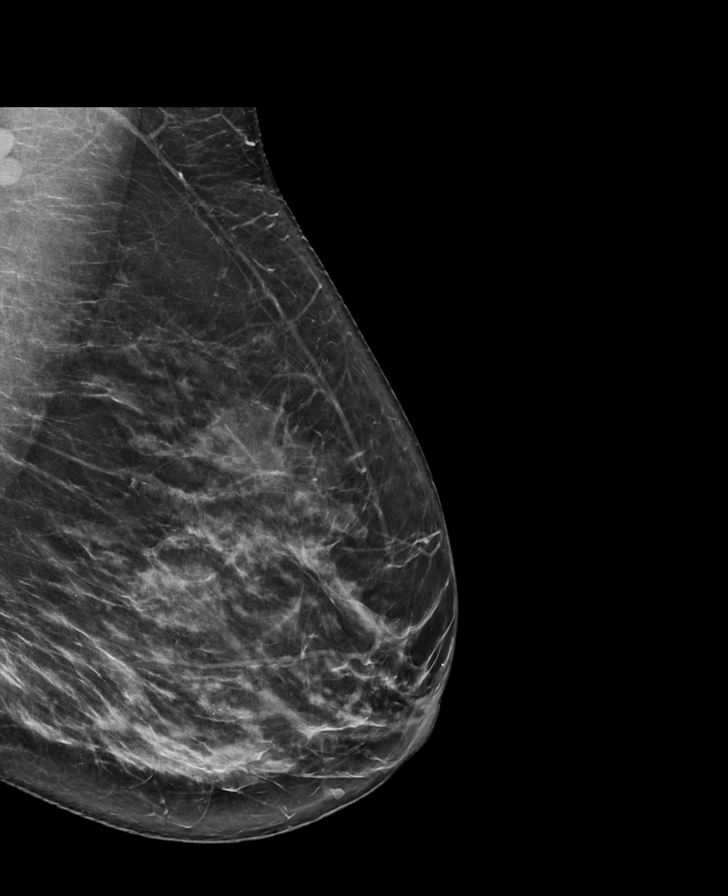

[L CC synth-2D]
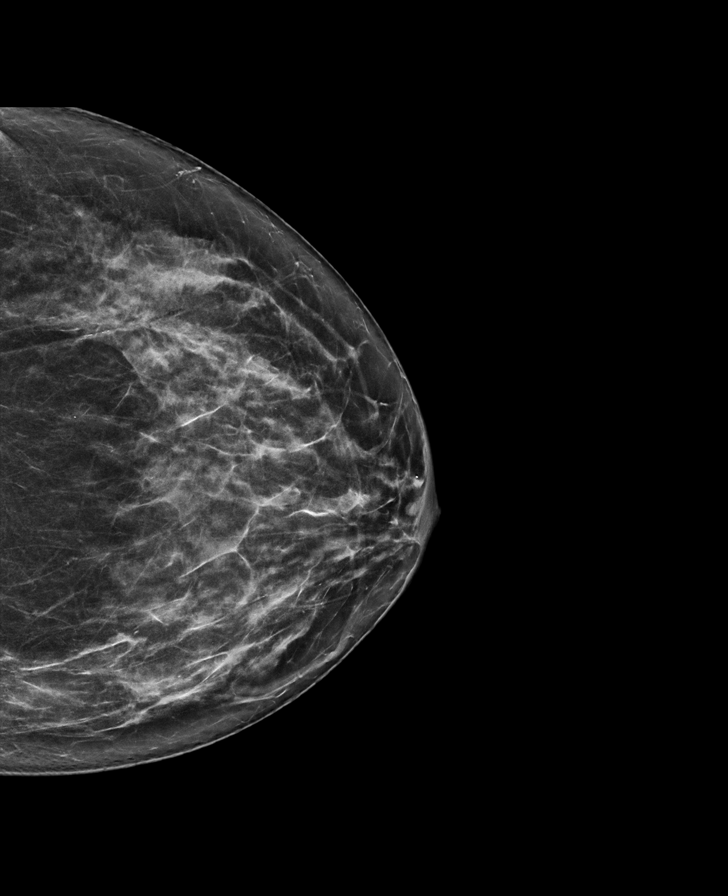

[R MLO synth-2D]
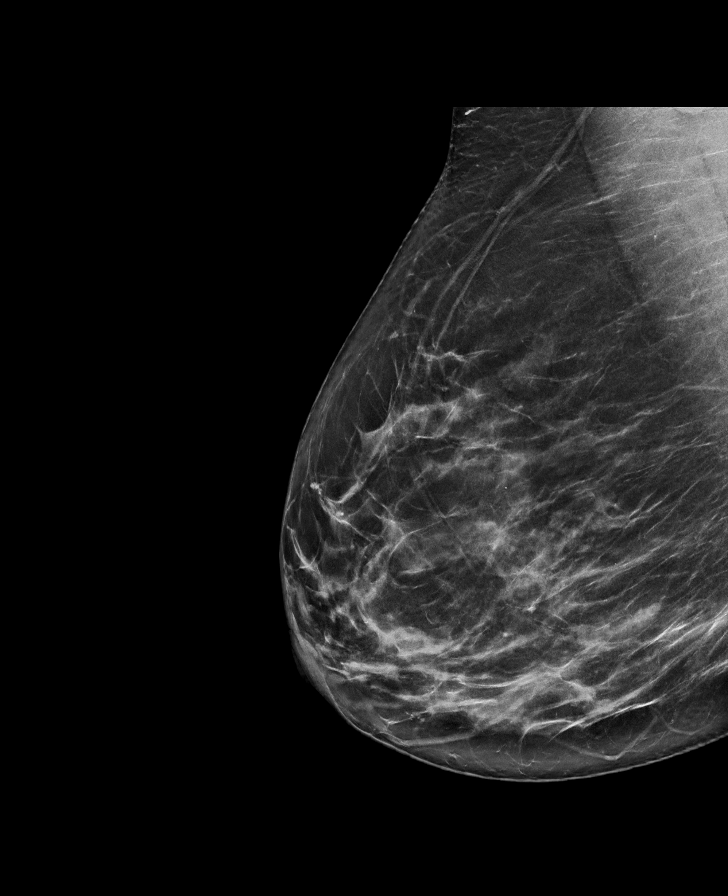

[R MLO tomo · tomo slice 39/77.0]
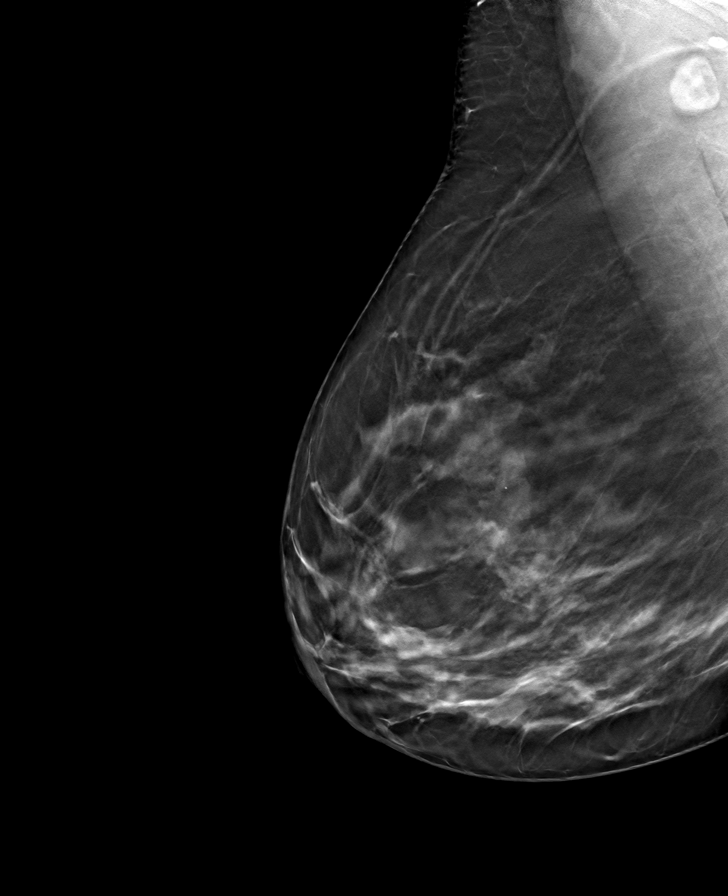

[L CC tomo · tomo slice 35/69.0]
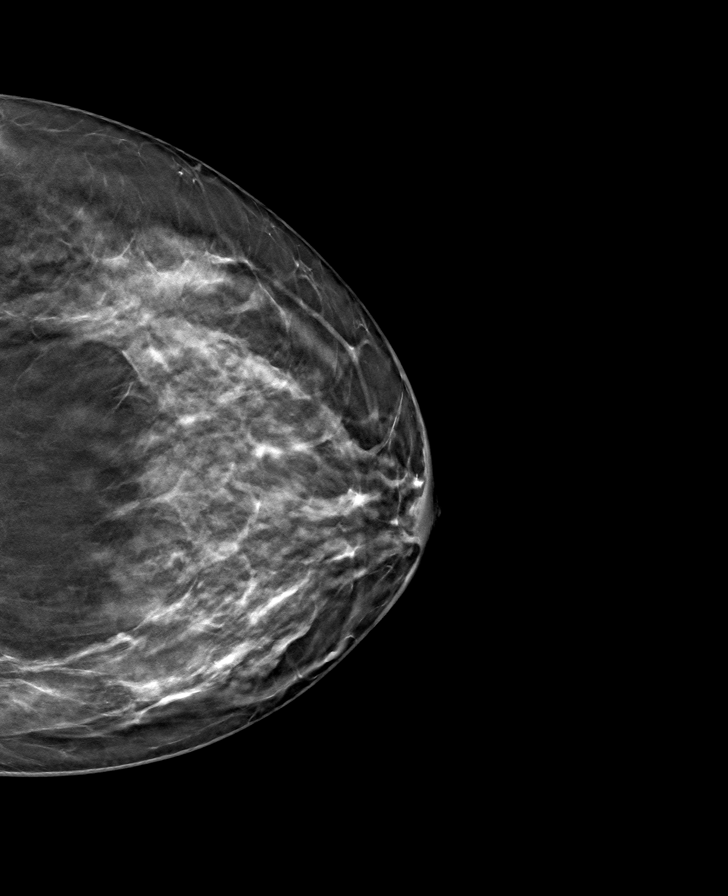

[L MLO tomo · tomo slice 39/76.0]
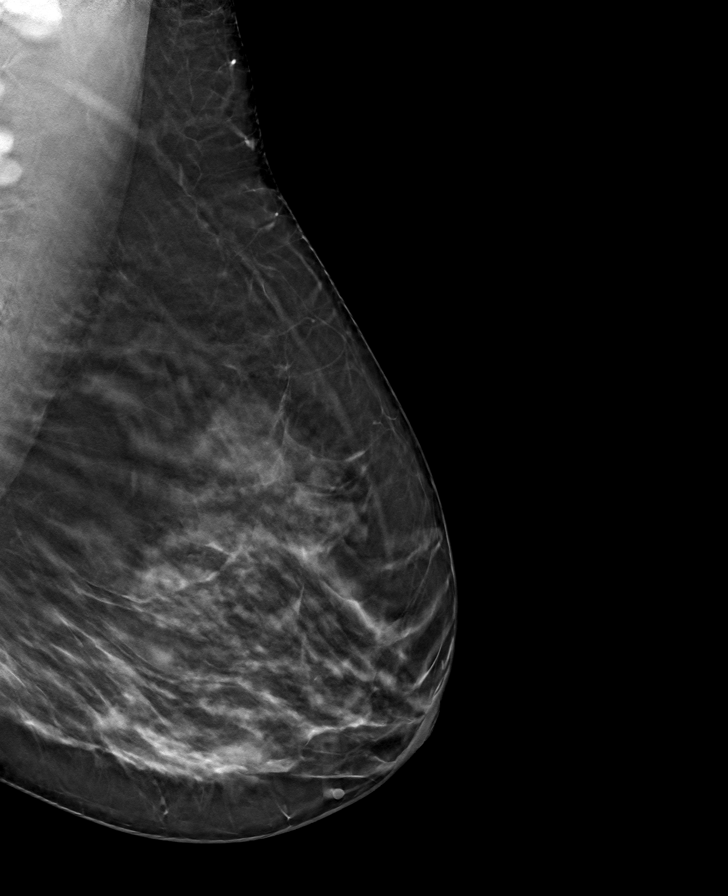

[R CC tomo · tomo slice 36/71.0]
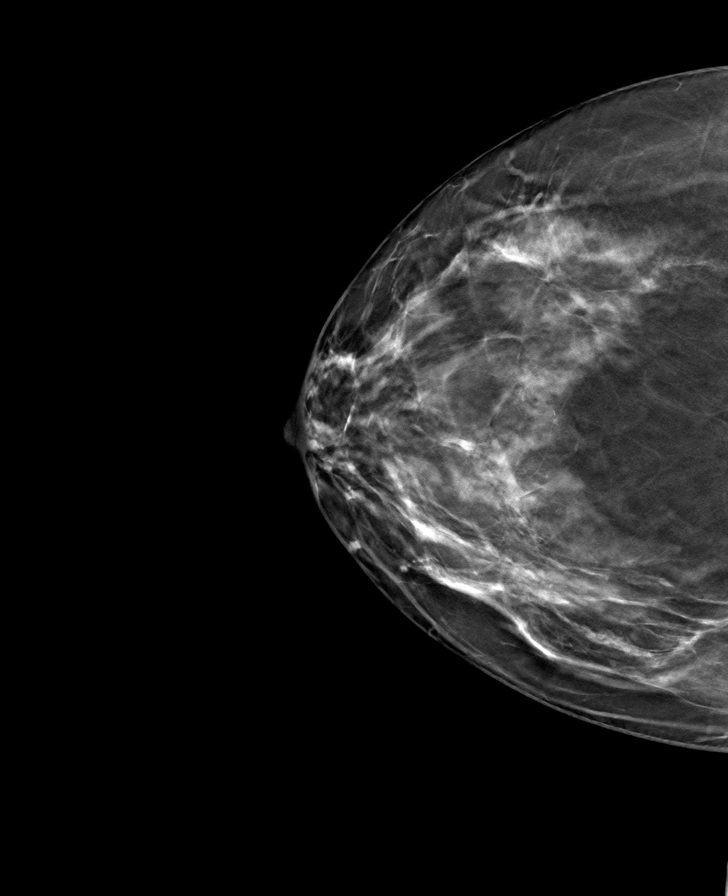

[8 of 24 positions shown; findings below may reference images not displayed]

ACR Breast Density Category c: The breast tissue is heterogeneously
dense, which may obscure small masses.
FINDINGS: There are no findings suspicious for malignancy. Images were
processed with CAD.
IMPRESSION: No mammographic evidence of malignancy. A result letter of this
screening mammogram will be mailed directly to the patient.

RECOMMENDATION:
Screening mammogram in one year. (Code:[id number])

BI-RADS CATEGORY  1: Negative.

## 2022-12-24 ENCOUNTER — Telehealth: Payer: Self-pay | Admitting: Nurse Practitioner

## 2022-12-24 NOTE — Telephone Encounter (Signed)
Lvmtcb to schedule an appt

## 2023-08-04 ENCOUNTER — Encounter: Payer: Self-pay | Admitting: Nurse Practitioner
